# Patient Record
Sex: Female | Born: 1965 | Race: White | Hispanic: No | Smoking: Former smoker
Health system: Southern US, Community
[De-identification: ages and names within clinical notes are randomized; demographics above are authoritative.]

## PROBLEM LIST (undated history)

## (undated) DIAGNOSIS — M810 Age-related osteoporosis without current pathological fracture: Secondary | ICD-10-CM

## (undated) DIAGNOSIS — G4700 Insomnia, unspecified: Secondary | ICD-10-CM

## (undated) DIAGNOSIS — E781 Pure hyperglyceridemia: Secondary | ICD-10-CM

## (undated) DIAGNOSIS — R000 Tachycardia, unspecified: Secondary | ICD-10-CM

## (undated) DIAGNOSIS — K219 Gastro-esophageal reflux disease without esophagitis: Secondary | ICD-10-CM

## (undated) DIAGNOSIS — R002 Palpitations: Secondary | ICD-10-CM

## (undated) DIAGNOSIS — F4001 Agoraphobia with panic disorder: Secondary | ICD-10-CM

## (undated) DIAGNOSIS — F17200 Nicotine dependence, unspecified, uncomplicated: Secondary | ICD-10-CM

## (undated) DIAGNOSIS — C55 Malignant neoplasm of uterus, part unspecified: Secondary | ICD-10-CM

## (undated) DIAGNOSIS — F4321 Adjustment disorder with depressed mood: Secondary | ICD-10-CM

## (undated) HISTORY — DX: Pure hyperglyceridemia: E78.1

## (undated) HISTORY — DX: Palpitations: R00.2

## (undated) HISTORY — DX: Insomnia, unspecified: G47.00

## (undated) HISTORY — PX: OTHER SURGICAL HISTORY: SHX169

## (undated) HISTORY — DX: Adjustment disorder with depressed mood: F43.21

## (undated) HISTORY — DX: Gastro-esophageal reflux disease without esophagitis: K21.9

## (undated) HISTORY — DX: Nicotine dependence, unspecified, uncomplicated: F17.200

## (undated) HISTORY — DX: Malignant neoplasm of uterus, part unspecified: C55

## (undated) HISTORY — DX: Tachycardia, unspecified: R00.0

## (undated) HISTORY — DX: Agoraphobia with panic disorder: F40.01

## (undated) HISTORY — PX: TUBAL LIGATION: SHX77

## (undated) HISTORY — DX: Age-related osteoporosis without current pathological fracture: M81.0

---

## 1998-02-13 HISTORY — PX: OTHER SURGICAL HISTORY: SHX169

## 2001-04-22 ENCOUNTER — Ambulatory Visit (HOSPITAL_COMMUNITY): Admission: RE | Admit: 2001-04-22 | Discharge: 2001-04-23 | Payer: Self-pay | Admitting: Pediatrics

## 2001-05-27 ENCOUNTER — Emergency Department (HOSPITAL_COMMUNITY): Admission: EM | Admit: 2001-05-27 | Discharge: 2001-05-27 | Payer: Self-pay | Admitting: Emergency Medicine

## 2004-03-14 ENCOUNTER — Emergency Department (HOSPITAL_COMMUNITY): Admission: EM | Admit: 2004-03-14 | Discharge: 2004-03-14 | Payer: Self-pay | Admitting: Emergency Medicine

## 2007-06-26 ENCOUNTER — Ambulatory Visit (HOSPITAL_COMMUNITY): Admission: RE | Admit: 2007-06-26 | Discharge: 2007-06-26 | Payer: Self-pay | Admitting: Family Medicine

## 2007-07-15 ENCOUNTER — Ambulatory Visit (HOSPITAL_COMMUNITY): Admission: RE | Admit: 2007-07-15 | Discharge: 2007-07-15 | Payer: Self-pay | Admitting: Family Medicine

## 2007-08-02 ENCOUNTER — Ambulatory Visit (HOSPITAL_COMMUNITY): Admission: RE | Admit: 2007-08-02 | Discharge: 2007-08-02 | Payer: Self-pay | Admitting: Family Medicine

## 2007-12-20 ENCOUNTER — Ambulatory Visit (HOSPITAL_COMMUNITY): Admission: RE | Admit: 2007-12-20 | Discharge: 2007-12-20 | Payer: Self-pay | Admitting: Family Medicine

## 2008-07-01 ENCOUNTER — Ambulatory Visit (HOSPITAL_COMMUNITY): Admission: RE | Admit: 2008-07-01 | Discharge: 2008-07-01 | Payer: Self-pay | Admitting: Obstetrics and Gynecology

## 2009-01-25 ENCOUNTER — Other Ambulatory Visit: Admission: RE | Admit: 2009-01-25 | Discharge: 2009-01-25 | Payer: Self-pay | Admitting: Obstetrics & Gynecology

## 2010-03-06 ENCOUNTER — Encounter: Payer: Self-pay | Admitting: Family Medicine

## 2010-06-13 ENCOUNTER — Other Ambulatory Visit (HOSPITAL_COMMUNITY): Payer: Self-pay | Admitting: Family Medicine

## 2010-06-13 DIAGNOSIS — Z139 Encounter for screening, unspecified: Secondary | ICD-10-CM

## 2010-06-27 ENCOUNTER — Ambulatory Visit (HOSPITAL_COMMUNITY): Payer: Self-pay

## 2010-07-04 ENCOUNTER — Ambulatory Visit (HOSPITAL_COMMUNITY)
Admission: RE | Admit: 2010-07-04 | Discharge: 2010-07-04 | Disposition: A | Discharge status: Home / Self Care | Payer: Medicare Other | Source: Ambulatory Visit | Attending: Family Medicine | Admitting: Family Medicine

## 2010-07-04 DIAGNOSIS — Z1231 Encounter for screening mammogram for malignant neoplasm of breast: Secondary | ICD-10-CM | POA: Insufficient documentation

## 2010-07-04 DIAGNOSIS — Z139 Encounter for screening, unspecified: Secondary | ICD-10-CM

## 2011-02-03 ENCOUNTER — Other Ambulatory Visit (HOSPITAL_COMMUNITY): Payer: Self-pay | Admitting: Family Medicine

## 2011-02-03 DIAGNOSIS — N898 Other specified noninflammatory disorders of vagina: Secondary | ICD-10-CM

## 2011-02-03 DIAGNOSIS — N7011 Chronic salpingitis: Secondary | ICD-10-CM

## 2011-02-09 ENCOUNTER — Ambulatory Visit (HOSPITAL_COMMUNITY): Payer: Medicare Other

## 2011-02-20 ENCOUNTER — Ambulatory Visit (HOSPITAL_COMMUNITY): Payer: Medicare Other

## 2011-07-24 ENCOUNTER — Other Ambulatory Visit (HOSPITAL_COMMUNITY): Payer: Self-pay | Admitting: Family Medicine

## 2011-07-24 DIAGNOSIS — Z139 Encounter for screening, unspecified: Secondary | ICD-10-CM

## 2011-07-25 ENCOUNTER — Ambulatory Visit (HOSPITAL_COMMUNITY)
Admission: RE | Admit: 2011-07-25 | Discharge: 2011-07-25 | Disposition: A | Discharge status: Home / Self Care | Payer: Medicare Other | Source: Ambulatory Visit | Attending: Family Medicine | Admitting: Family Medicine

## 2011-07-25 DIAGNOSIS — Z139 Encounter for screening, unspecified: Secondary | ICD-10-CM

## 2011-07-25 DIAGNOSIS — Z1231 Encounter for screening mammogram for malignant neoplasm of breast: Secondary | ICD-10-CM | POA: Insufficient documentation

## 2011-08-01 ENCOUNTER — Other Ambulatory Visit: Payer: Self-pay | Admitting: Family Medicine

## 2011-08-01 DIAGNOSIS — R928 Other abnormal and inconclusive findings on diagnostic imaging of breast: Secondary | ICD-10-CM

## 2011-08-16 ENCOUNTER — Ambulatory Visit (HOSPITAL_COMMUNITY)
Admission: RE | Admit: 2011-08-16 | Discharge: 2011-08-16 | Disposition: A | Discharge status: Home / Self Care | Payer: Medicare Other | Source: Ambulatory Visit | Attending: Family Medicine | Admitting: Family Medicine

## 2011-08-16 DIAGNOSIS — R928 Other abnormal and inconclusive findings on diagnostic imaging of breast: Secondary | ICD-10-CM | POA: Diagnosis not present

## 2011-08-16 DIAGNOSIS — N63 Unspecified lump in unspecified breast: Secondary | ICD-10-CM | POA: Diagnosis not present

## 2011-08-16 DIAGNOSIS — F329 Major depressive disorder, single episode, unspecified: Secondary | ICD-10-CM | POA: Diagnosis not present

## 2011-08-16 DIAGNOSIS — Z87891 Personal history of nicotine dependence: Secondary | ICD-10-CM | POA: Diagnosis not present

## 2011-08-16 DIAGNOSIS — R000 Tachycardia, unspecified: Secondary | ICD-10-CM | POA: Diagnosis not present

## 2011-08-16 DIAGNOSIS — F4001 Agoraphobia with panic disorder: Secondary | ICD-10-CM | POA: Diagnosis not present

## 2012-02-02 DIAGNOSIS — R000 Tachycardia, unspecified: Secondary | ICD-10-CM | POA: Diagnosis not present

## 2012-02-02 DIAGNOSIS — F4001 Agoraphobia with panic disorder: Secondary | ICD-10-CM | POA: Diagnosis not present

## 2012-02-02 DIAGNOSIS — F329 Major depressive disorder, single episode, unspecified: Secondary | ICD-10-CM | POA: Diagnosis not present

## 2012-02-02 DIAGNOSIS — D259 Leiomyoma of uterus, unspecified: Secondary | ICD-10-CM | POA: Diagnosis not present

## 2012-02-12 ENCOUNTER — Other Ambulatory Visit (HOSPITAL_COMMUNITY): Payer: Self-pay | Admitting: Family Medicine

## 2012-02-12 DIAGNOSIS — N951 Menopausal and female climacteric states: Secondary | ICD-10-CM

## 2012-02-16 ENCOUNTER — Ambulatory Visit (HOSPITAL_COMMUNITY): Admission: RE | Admit: 2012-02-16 | Payer: Medicare Other | Source: Ambulatory Visit

## 2012-02-16 ENCOUNTER — Ambulatory Visit (HOSPITAL_COMMUNITY): Payer: Medicare Other

## 2012-07-28 ENCOUNTER — Other Ambulatory Visit: Payer: Self-pay | Admitting: Family Medicine

## 2012-07-28 DIAGNOSIS — Z139 Encounter for screening, unspecified: Secondary | ICD-10-CM

## 2012-07-29 ENCOUNTER — Ambulatory Visit (HOSPITAL_COMMUNITY)
Admission: RE | Admit: 2012-07-29 | Discharge: 2012-07-29 | Disposition: A | Discharge status: Home / Self Care | Payer: Medicare Other | Source: Ambulatory Visit | Attending: Family Medicine | Admitting: Family Medicine

## 2012-07-29 DIAGNOSIS — Z139 Encounter for screening, unspecified: Secondary | ICD-10-CM

## 2012-07-29 DIAGNOSIS — Z1231 Encounter for screening mammogram for malignant neoplasm of breast: Secondary | ICD-10-CM | POA: Diagnosis not present

## 2012-11-15 DIAGNOSIS — Z0000 Encounter for general adult medical examination without abnormal findings: Secondary | ICD-10-CM | POA: Diagnosis not present

## 2012-11-15 DIAGNOSIS — F4001 Agoraphobia with panic disorder: Secondary | ICD-10-CM | POA: Diagnosis not present

## 2012-11-15 DIAGNOSIS — R000 Tachycardia, unspecified: Secondary | ICD-10-CM | POA: Diagnosis not present

## 2012-11-15 DIAGNOSIS — F329 Major depressive disorder, single episode, unspecified: Secondary | ICD-10-CM | POA: Diagnosis not present

## 2012-12-04 ENCOUNTER — Emergency Department (HOSPITAL_COMMUNITY)
Admission: EM | Admit: 2012-12-04 | Discharge: 2012-12-04 | Disposition: A | Discharge status: Home / Self Care | Payer: Medicare Other

## 2013-07-24 ENCOUNTER — Other Ambulatory Visit (HOSPITAL_COMMUNITY): Payer: Self-pay | Admitting: Family Medicine

## 2013-07-24 DIAGNOSIS — Z1231 Encounter for screening mammogram for malignant neoplasm of breast: Secondary | ICD-10-CM

## 2013-08-04 ENCOUNTER — Ambulatory Visit (HOSPITAL_COMMUNITY): Payer: Medicare Other

## 2013-08-25 ENCOUNTER — Ambulatory Visit (HOSPITAL_COMMUNITY)
Admission: RE | Admit: 2013-08-25 | Discharge: 2013-08-25 | Disposition: A | Discharge status: Home / Self Care | Payer: Medicare Other | Source: Ambulatory Visit | Attending: Family Medicine | Admitting: Family Medicine

## 2013-08-25 DIAGNOSIS — Z1231 Encounter for screening mammogram for malignant neoplasm of breast: Secondary | ICD-10-CM | POA: Diagnosis not present

## 2013-11-10 DIAGNOSIS — F329 Major depressive disorder, single episode, unspecified: Secondary | ICD-10-CM | POA: Diagnosis not present

## 2013-11-10 DIAGNOSIS — R000 Tachycardia, unspecified: Secondary | ICD-10-CM | POA: Diagnosis not present

## 2013-11-10 DIAGNOSIS — G4700 Insomnia, unspecified: Secondary | ICD-10-CM | POA: Diagnosis not present

## 2013-11-10 DIAGNOSIS — F4001 Agoraphobia with panic disorder: Secondary | ICD-10-CM | POA: Diagnosis not present

## 2014-05-01 DIAGNOSIS — F419 Anxiety disorder, unspecified: Secondary | ICD-10-CM | POA: Diagnosis not present

## 2014-05-01 DIAGNOSIS — B351 Tinea unguium: Secondary | ICD-10-CM | POA: Diagnosis not present

## 2014-05-01 DIAGNOSIS — G4700 Insomnia, unspecified: Secondary | ICD-10-CM | POA: Diagnosis not present

## 2014-05-01 DIAGNOSIS — R000 Tachycardia, unspecified: Secondary | ICD-10-CM | POA: Diagnosis not present

## 2014-08-04 ENCOUNTER — Other Ambulatory Visit (HOSPITAL_COMMUNITY): Payer: Self-pay | Admitting: Family Medicine

## 2014-08-04 DIAGNOSIS — Z1231 Encounter for screening mammogram for malignant neoplasm of breast: Secondary | ICD-10-CM

## 2014-08-31 ENCOUNTER — Ambulatory Visit (HOSPITAL_COMMUNITY)
Admission: RE | Admit: 2014-08-31 | Discharge: 2014-08-31 | Disposition: A | Discharge status: Home / Self Care | Payer: Medicare Other | Source: Ambulatory Visit | Attending: Family Medicine | Admitting: Family Medicine

## 2014-08-31 DIAGNOSIS — Z1231 Encounter for screening mammogram for malignant neoplasm of breast: Secondary | ICD-10-CM | POA: Diagnosis not present

## 2014-11-06 DIAGNOSIS — S81011A Laceration without foreign body, right knee, initial encounter: Secondary | ICD-10-CM | POA: Diagnosis not present

## 2014-11-06 DIAGNOSIS — L700 Acne vulgaris: Secondary | ICD-10-CM | POA: Diagnosis not present

## 2014-11-06 DIAGNOSIS — B351 Tinea unguium: Secondary | ICD-10-CM | POA: Diagnosis not present

## 2014-11-06 DIAGNOSIS — F419 Anxiety disorder, unspecified: Secondary | ICD-10-CM | POA: Diagnosis not present

## 2014-11-11 DIAGNOSIS — E785 Hyperlipidemia, unspecified: Secondary | ICD-10-CM | POA: Diagnosis not present

## 2015-05-28 DIAGNOSIS — F4321 Adjustment disorder with depressed mood: Secondary | ICD-10-CM | POA: Diagnosis not present

## 2015-05-28 DIAGNOSIS — G4700 Insomnia, unspecified: Secondary | ICD-10-CM | POA: Diagnosis not present

## 2015-05-28 DIAGNOSIS — F419 Anxiety disorder, unspecified: Secondary | ICD-10-CM | POA: Diagnosis not present

## 2015-05-28 DIAGNOSIS — H9312 Tinnitus, left ear: Secondary | ICD-10-CM | POA: Diagnosis not present

## 2015-09-02 ENCOUNTER — Other Ambulatory Visit (HOSPITAL_COMMUNITY): Payer: Self-pay | Admitting: Family Medicine

## 2015-09-02 DIAGNOSIS — Z1231 Encounter for screening mammogram for malignant neoplasm of breast: Secondary | ICD-10-CM

## 2015-09-06 ENCOUNTER — Ambulatory Visit (HOSPITAL_COMMUNITY)
Admission: RE | Admit: 2015-09-06 | Discharge: 2015-09-06 | Disposition: A | Discharge status: Home / Self Care | Payer: Medicare Other | Source: Ambulatory Visit | Attending: Family Medicine | Admitting: Family Medicine

## 2015-09-06 ENCOUNTER — Encounter (HOSPITAL_COMMUNITY): Payer: Self-pay | Admitting: Radiology

## 2015-09-06 DIAGNOSIS — Z1231 Encounter for screening mammogram for malignant neoplasm of breast: Secondary | ICD-10-CM | POA: Diagnosis not present

## 2015-09-30 DIAGNOSIS — G4700 Insomnia, unspecified: Secondary | ICD-10-CM | POA: Diagnosis not present

## 2015-09-30 DIAGNOSIS — F419 Anxiety disorder, unspecified: Secondary | ICD-10-CM | POA: Diagnosis not present

## 2015-09-30 DIAGNOSIS — J018 Other acute sinusitis: Secondary | ICD-10-CM | POA: Diagnosis not present

## 2015-09-30 DIAGNOSIS — H9313 Tinnitus, bilateral: Secondary | ICD-10-CM | POA: Diagnosis not present

## 2015-09-30 DIAGNOSIS — G4452 New daily persistent headache (NDPH): Secondary | ICD-10-CM | POA: Diagnosis not present

## 2015-10-01 ENCOUNTER — Other Ambulatory Visit (HOSPITAL_COMMUNITY): Payer: Self-pay | Admitting: Neurology

## 2015-10-01 DIAGNOSIS — R519 Headache, unspecified: Secondary | ICD-10-CM

## 2015-10-01 DIAGNOSIS — R51 Headache: Principal | ICD-10-CM

## 2015-10-11 ENCOUNTER — Ambulatory Visit (HOSPITAL_COMMUNITY)
Admission: RE | Admit: 2015-10-11 | Discharge: 2015-10-11 | Disposition: A | Discharge status: Home / Self Care | Payer: Medicare Other | Source: Ambulatory Visit | Attending: Neurology | Admitting: Neurology

## 2015-10-11 DIAGNOSIS — R51 Headache: Secondary | ICD-10-CM | POA: Diagnosis not present

## 2015-10-11 DIAGNOSIS — R519 Headache, unspecified: Secondary | ICD-10-CM

## 2015-10-27 DIAGNOSIS — J018 Other acute sinusitis: Secondary | ICD-10-CM | POA: Diagnosis not present

## 2015-10-27 DIAGNOSIS — F419 Anxiety disorder, unspecified: Secondary | ICD-10-CM | POA: Diagnosis not present

## 2015-10-27 DIAGNOSIS — H9313 Tinnitus, bilateral: Secondary | ICD-10-CM | POA: Diagnosis not present

## 2015-10-27 DIAGNOSIS — G43701 Chronic migraine without aura, not intractable, with status migrainosus: Secondary | ICD-10-CM | POA: Diagnosis not present

## 2015-10-27 DIAGNOSIS — G4700 Insomnia, unspecified: Secondary | ICD-10-CM | POA: Diagnosis not present

## 2015-11-15 ENCOUNTER — Ambulatory Visit (INDEPENDENT_AMBULATORY_CARE_PROVIDER_SITE_OTHER): Payer: Medicare Other | Admitting: Otolaryngology

## 2015-11-15 DIAGNOSIS — H9312 Tinnitus, left ear: Secondary | ICD-10-CM | POA: Diagnosis not present

## 2015-11-15 DIAGNOSIS — H903 Sensorineural hearing loss, bilateral: Secondary | ICD-10-CM | POA: Diagnosis not present

## 2016-01-12 DIAGNOSIS — H9192 Unspecified hearing loss, left ear: Secondary | ICD-10-CM | POA: Diagnosis not present

## 2016-01-12 DIAGNOSIS — F419 Anxiety disorder, unspecified: Secondary | ICD-10-CM | POA: Diagnosis not present

## 2016-01-12 DIAGNOSIS — E785 Hyperlipidemia, unspecified: Secondary | ICD-10-CM | POA: Diagnosis not present

## 2016-01-12 DIAGNOSIS — J320 Chronic maxillary sinusitis: Secondary | ICD-10-CM | POA: Diagnosis not present

## 2016-01-12 DIAGNOSIS — K210 Gastro-esophageal reflux disease with esophagitis: Secondary | ICD-10-CM | POA: Diagnosis not present

## 2016-04-10 ENCOUNTER — Encounter: Payer: Self-pay | Admitting: Obstetrics & Gynecology

## 2016-05-15 ENCOUNTER — Ambulatory Visit (INDEPENDENT_AMBULATORY_CARE_PROVIDER_SITE_OTHER): Payer: Medicare Other | Admitting: Otolaryngology

## 2016-09-11 ENCOUNTER — Other Ambulatory Visit (HOSPITAL_COMMUNITY): Payer: Self-pay | Admitting: Family Medicine

## 2016-09-11 DIAGNOSIS — Z1231 Encounter for screening mammogram for malignant neoplasm of breast: Secondary | ICD-10-CM

## 2016-09-18 ENCOUNTER — Ambulatory Visit (HOSPITAL_COMMUNITY): Payer: Medicare Other

## 2016-09-25 ENCOUNTER — Ambulatory Visit (HOSPITAL_COMMUNITY)
Admission: RE | Admit: 2016-09-25 | Discharge: 2016-09-25 | Disposition: A | Discharge status: Home / Self Care | Payer: Medicare Other | Source: Ambulatory Visit | Attending: Family Medicine | Admitting: Family Medicine

## 2016-09-25 DIAGNOSIS — Z1231 Encounter for screening mammogram for malignant neoplasm of breast: Secondary | ICD-10-CM | POA: Diagnosis present

## 2016-11-16 IMAGING — CT CT HEAD W/O CM
3 of 4 series · 16 of 47 positions shown, 19 images · non-contrast
Comparison: None.

CLINICAL DATA: Headache, dizziness, blurred vision for 2-3 months.

EXAM:
CT HEAD WITHOUT CONTRAST
TECHNIQUE: Contiguous axial images were obtained from the base of the skull
through the vertex without intravenous contrast.

[Series 2: head w/o · axial · non-contrast · 0.43mm/px · z∈[+1188,+1328]mm · 10 of 41 slices shown, 13 images]
[im 3/41  brain]
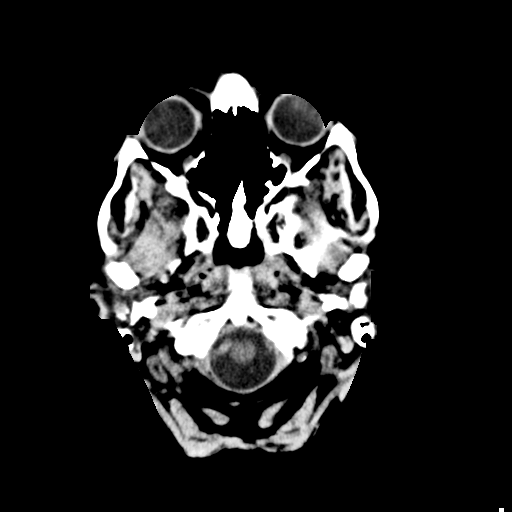
[im 3/41  bone]
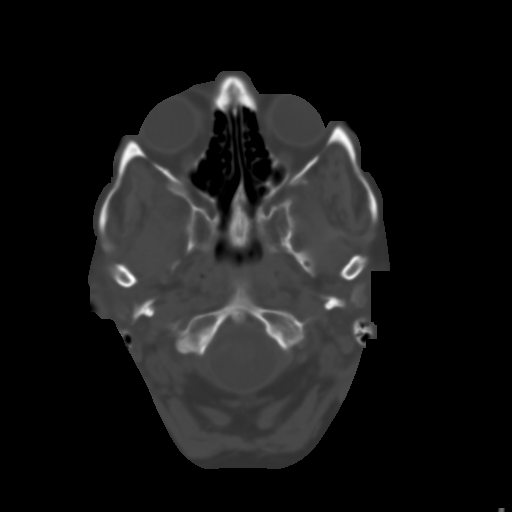
[im 6/41  brain]
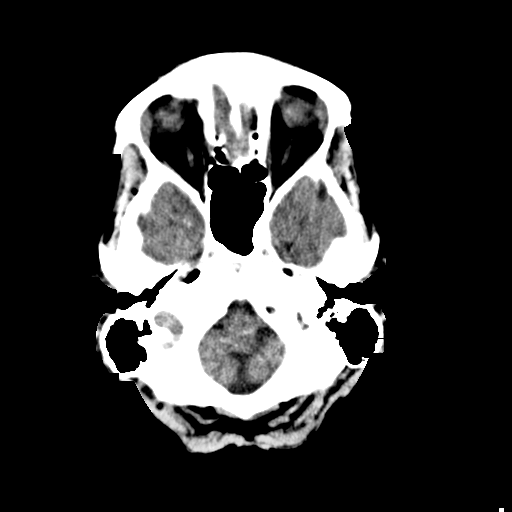
[im 12/41  brain]
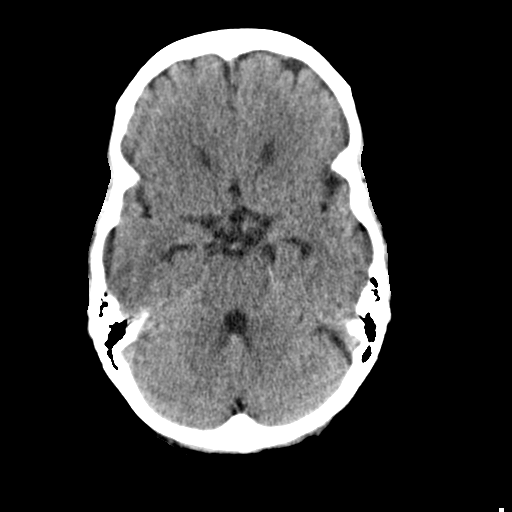
[im 15/41  brain]
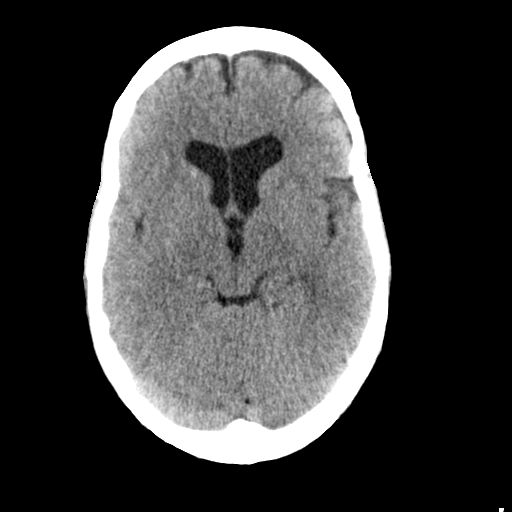
[im 18/41  brain]
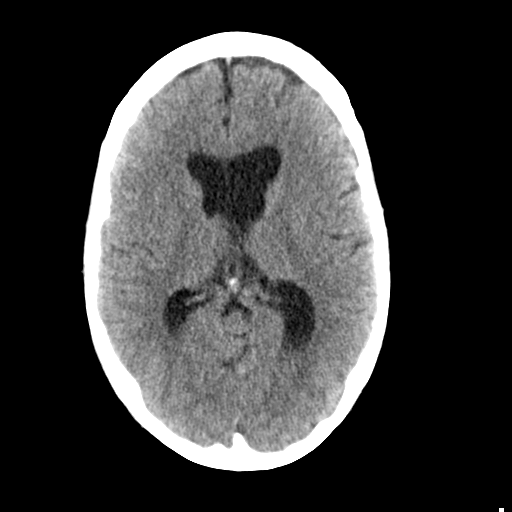
[im 18/41  bone]
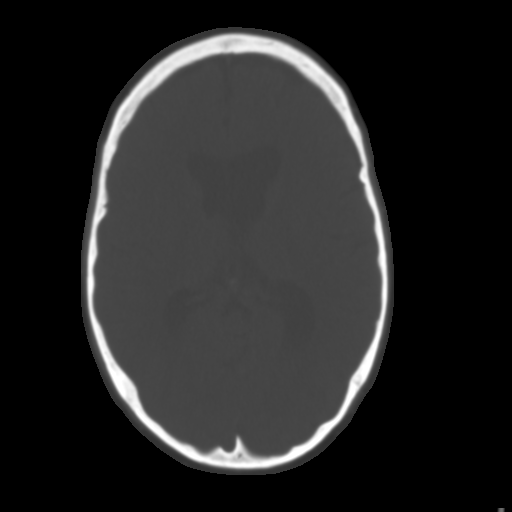
[im 23/41  brain]
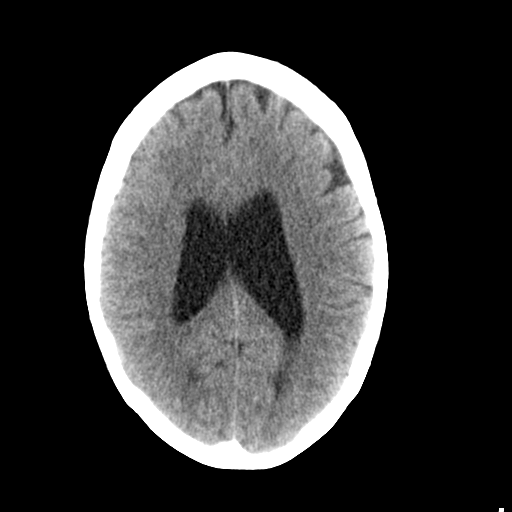
[im 26/41  brain]
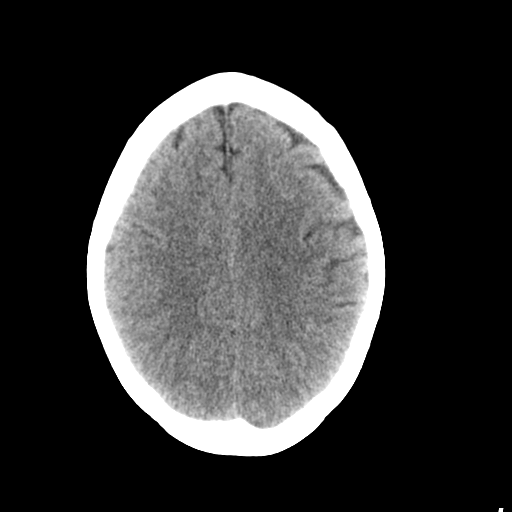
[im 29/41  brain]
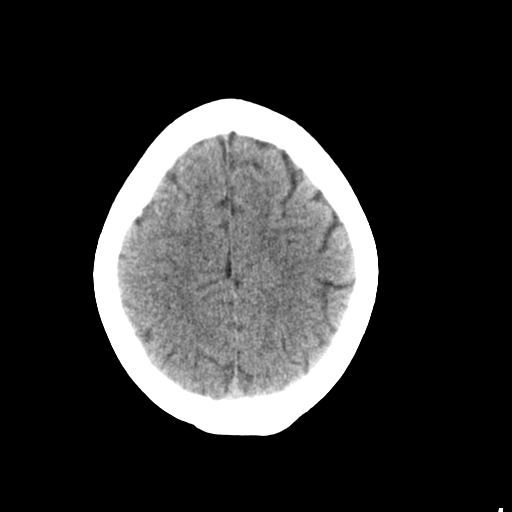
[im 35/41  brain]
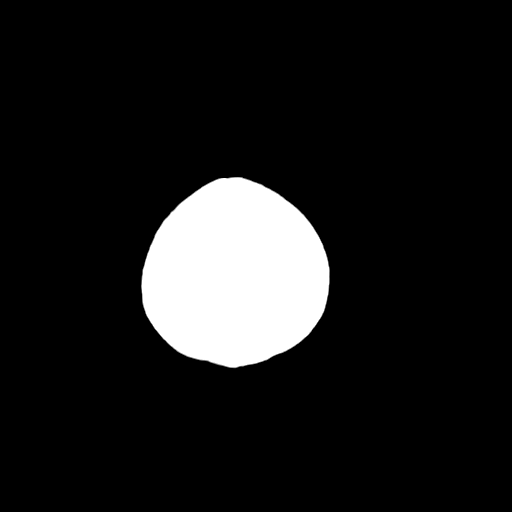
[im 35/41  bone]
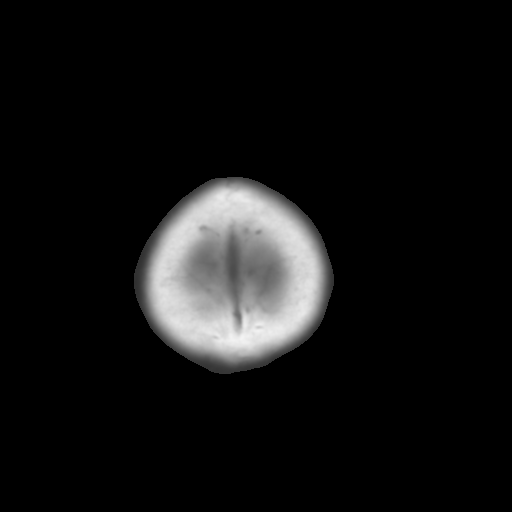
[im 38/41  brain]
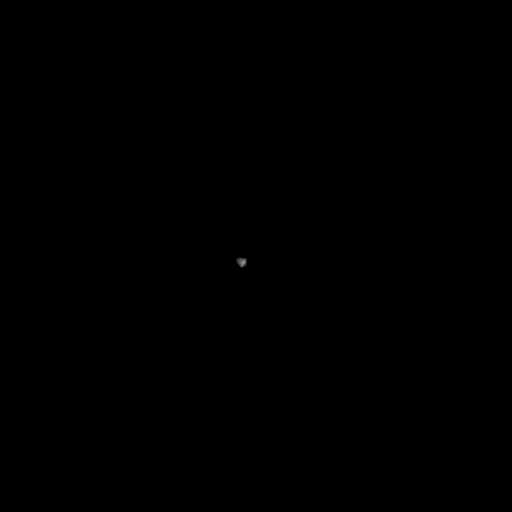

[Series 4: coronal · coronal · 0.30mm/px · 3 of 70 slices shown]
[im 24/70  brain]
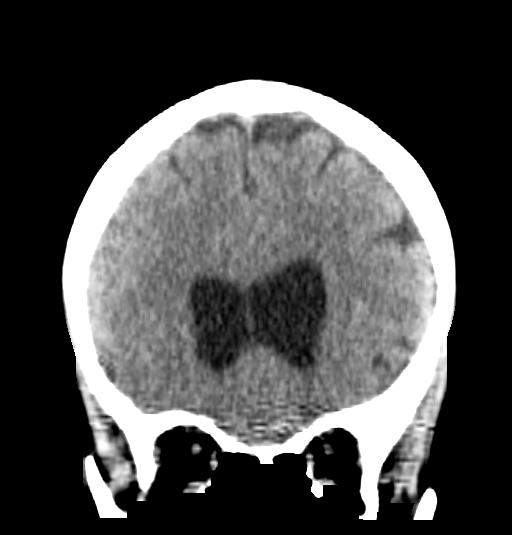
[im 31/70  brain]
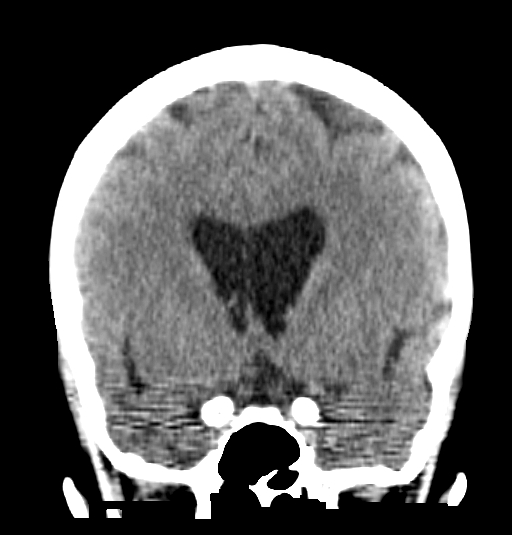
[im 39/70  brain]
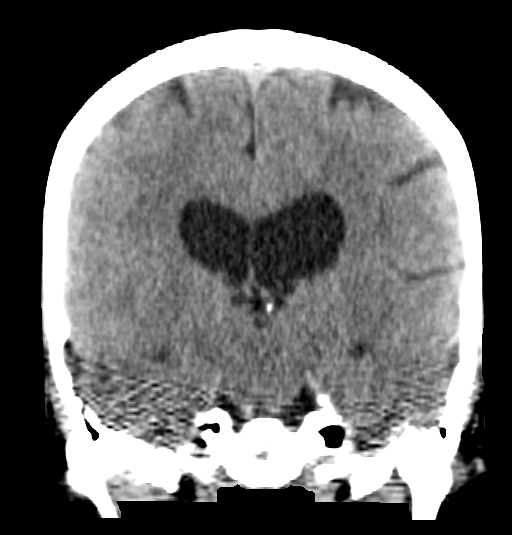

[Series 5: sagittal · sagittal · 0.33mm/px · 3 of 52 slices shown]
[im 18/52  brain]
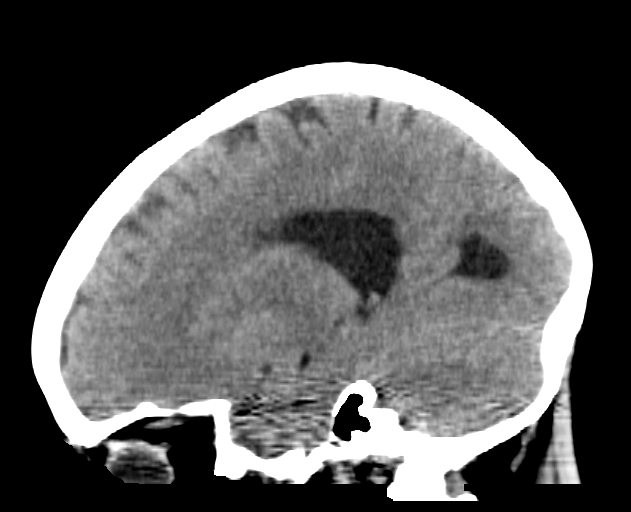
[im 26/52  brain]
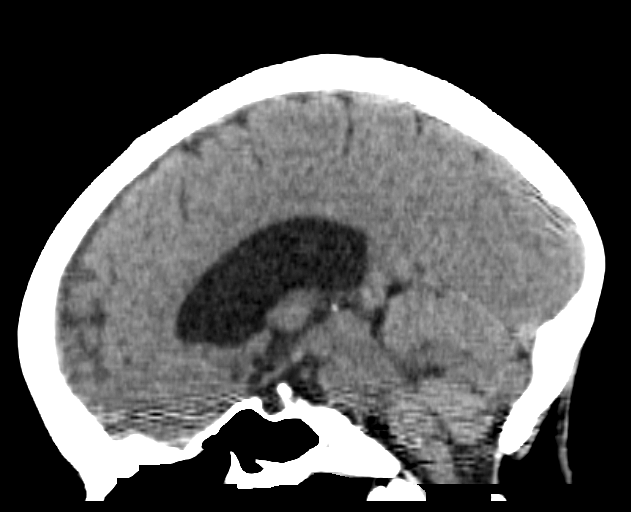
[im 35/52  brain]
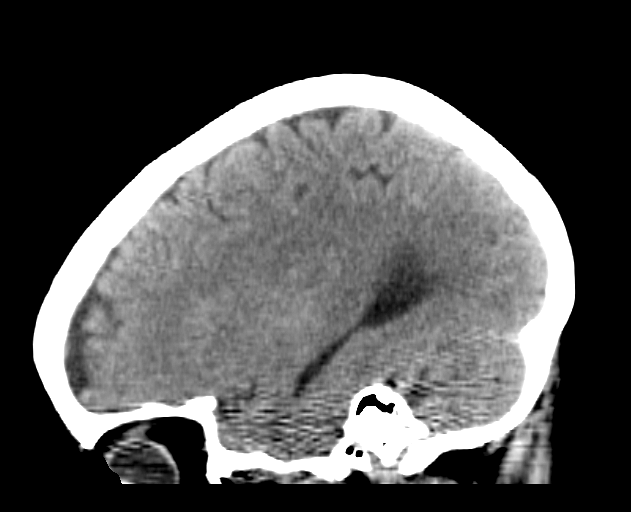

[16 of 47 positions shown; findings below may reference images not displayed]

FINDINGS: Brain: No acute intracranial abnormality. Specifically, no
hemorrhage, hydrocephalus, mass lesion, acute infarction, or
significant intracranial injury.

Vascular: No hyperdense vessel or unexpected calcification.

Skull: No acute calvarial abnormality

Sinuses/Orbits: Visualized paranasal sinuses and mastoids clear.
Orbital soft tissues unremarkable.

Other: None
IMPRESSION: No intracranial abnormality.

## 2017-09-17 ENCOUNTER — Other Ambulatory Visit (HOSPITAL_COMMUNITY): Payer: Self-pay | Admitting: Family Medicine

## 2017-09-17 DIAGNOSIS — Z1231 Encounter for screening mammogram for malignant neoplasm of breast: Secondary | ICD-10-CM

## 2017-10-01 ENCOUNTER — Ambulatory Visit (HOSPITAL_COMMUNITY)
Admission: RE | Admit: 2017-10-01 | Discharge: 2017-10-01 | Disposition: A | Discharge status: Home / Self Care | Payer: Medicare Other | Source: Ambulatory Visit | Attending: Family Medicine | Admitting: Family Medicine

## 2017-10-01 DIAGNOSIS — Z1231 Encounter for screening mammogram for malignant neoplasm of breast: Secondary | ICD-10-CM

## 2018-02-07 ENCOUNTER — Ambulatory Visit: Payer: Medicare Other | Admitting: Cardiology

## 2018-02-22 ENCOUNTER — Ambulatory Visit (INDEPENDENT_AMBULATORY_CARE_PROVIDER_SITE_OTHER): Payer: Medicare Other | Admitting: Cardiology

## 2018-02-22 ENCOUNTER — Encounter: Payer: Self-pay | Admitting: Cardiology

## 2018-02-22 ENCOUNTER — Ambulatory Visit (INDEPENDENT_AMBULATORY_CARE_PROVIDER_SITE_OTHER): Payer: Medicare Other

## 2018-02-22 VITALS — BP 114/60 | Ht 65.5 in | Wt 146.0 lb

## 2018-02-22 DIAGNOSIS — R002 Palpitations: Secondary | ICD-10-CM | POA: Diagnosis not present

## 2018-02-22 DIAGNOSIS — Z01810 Encounter for preprocedural cardiovascular examination: Secondary | ICD-10-CM

## 2018-02-22 NOTE — Progress Notes (Signed)
Clinical Summary Ms. Shippey is a 53 y.o.female seen as new consult, referred by Dr Sudie Bailey for palpitations  1. Palpitations - previously seen by Dr Dietrich Pates she thinks late 1990s for palpitations. Seen by another cardiologist a few years later for same issue. Reports wearing event monitors during that time, believes they were benign or perhaps some extra heart beats.   Ongoing feeling of heart racing, can occur at rest. No other associated symptoms.  - lasts 15-30 minutes - reports chronic high stress - has been atentolol over 20 years - soft bp's limit av nodal agents - heistant to try new meds.She would favor staying on atenolol and increasing if needed   2. Preoperative evaluation - being considered for polyp removal by ob/gyn Walks up flight of stairs regularly at home without troubles.      Past Medical History:  Diagnosis Date  . Adjustment disorder with depressed mood   . Agoraphobia with panic attacks   . GERD (gastroesophageal reflux disease)   . Heart palpitations   . Insomnia   . Nicotine dependence   . Pure hyperglyceridemia   . Tachycardia      Allergies  Allergen Reactions  . Antihistamines, Chlorpheniramine-Type     Allergic to antihistamines too     Current Outpatient Medications  Medication Sig Dispense Refill  . acetaminophen (TYLENOL) 500 MG tablet Take 500 mg by mouth every 6 (six) hours as needed.    . ALPRAZolam (XANAX) 1 MG tablet Take 1 mg by mouth 4 (four) times daily as needed for anxiety.    Marland Kitchen atenolol (TENORMIN) 25 MG tablet Take 12.5 mg by mouth daily.    . calcium carbonate (TUMS - DOSED IN MG ELEMENTAL CALCIUM) 500 MG chewable tablet Chew 1 tablet by mouth daily.    Marland Kitchen doxepin (SINEQUAN) 10 MG capsule Take 10 mg by mouth. Take up to 5 pills at bedtime    . fenofibrate (TRICOR) 145 MG tablet Take 145 mg by mouth daily.    Marland Kitchen omeprazole (PRILOSEC) 20 MG capsule Take 20 mg by mouth daily.     No current facility-administered  medications for this visit.         Allergies  Allergen Reactions  . Antihistamines, Chlorpheniramine-Type     Allergic to antihistamines too      Family History  Problem Relation Age of Onset  . Heart attack Father      Social History Ms. Bunting has no history on file for tobacco. Ms. Valk has no history on file for alcohol.   Review of Systems CONSTITUTIONAL: No weight loss, fever, chills, weakness or fatigue.  HEENT: Eyes: No visual loss, blurred vision, double vision or yellow sclerae.No hearing loss, sneezing, congestion, runny nose or sore throat.  SKIN: No rash or itching.  CARDIOVASCULAR: per hpi RESPIRATORY: No shortness of breath, cough or sputum.  GASTROINTESTINAL: No anorexia, nausea, vomiting or diarrhea. No abdominal pain or blood.  GENITOURINARY: No burning on urination, no polyuria NEUROLOGICAL: No headache, dizziness, syncope, paralysis, ataxia, numbness or tingling in the extremities. No change in bowel or bladder control.  MUSCULOSKELETAL: No muscle, back pain, joint pain or stiffness.  LYMPHATICS: No enlarged nodes. No history of splenectomy.  PSYCHIATRIC: No history of depression or anxiety.  ENDOCRINOLOGIC: No reports of sweating, cold or heat intolerance. No polyuria or polydipsia.  Marland Kitchen   Physical Examination Vitals:   02/22/18 1502 02/22/18 1511  BP: 116/62 114/60  SpO2: 98%    Vitals:   02/22/18  1502  Weight: 146 lb (66.2 kg)  Height: 5' 5.5" (1.664 m)    Gen: resting comfortably, no acute distress HEENT: no scleral icterus, pupils equal round and reactive, no palptable cervical adenopathy,  CV: RRR, no m/r/g, no jvd Resp: Clear to auscultation bilaterally GI: abdomen is soft, non-tender, non-distended, normal bowel sounds, no hepatosplenomegaly MSK: extremities are warm, no edema.  Skin: warm, no rash Neuro:  no focal deficits Psych: appropriate affect      Assessment and Plan  1. Palpitations - obtain 7 day event  monitor to further evaluate - if med changes needed, would increase atenolol to 12.5mg  bid as she is not in favor of trying alternative meds due to prior medication sensitivities - EKG today shows sinus rhythm at 100  2. Preoperative evaluation - tolerates greater than without troubles - if benign cardiac monitor would recommend proceeding with ob/gyn surgery as planned   F/u pending monitor results   Antoine Poche, M.D.

## 2018-02-22 NOTE — Patient Instructions (Signed)
Medication Instructions:  Your physician recommends that you continue on your current medications as directed. Please refer to the Current Medication list given to you today.   Labwork: I WILL REQUEST LABS FROM PCP  Testing/Procedures: Your physician has recommended that you wear an event monitor. Event monitors are medical devices that record the heart's electrical activity. Doctors most often [place] these monitors to diagnose arrhythmias. Arrhythmias are problems with the speed or rhythm of the heartbeat. The monitor is a small, portable device. You can wear one while you do your normal daily activities. This is usually used to diagnose what is causing palpitations/syncope (passing out).   Follow-Up: Your physician recommends that you schedule a follow-up appointment in: PENDING TEST RESULTS    Any Other Special Instructions Will Be Listed Below (If Applicable).     If you need a refill on your cardiac medications before your next appointment, please call your pharmacy.

## 2018-03-01 DIAGNOSIS — R002 Palpitations: Secondary | ICD-10-CM | POA: Diagnosis not present

## 2018-03-13 ENCOUNTER — Telehealth: Payer: Self-pay | Admitting: Cardiovascular Disease

## 2018-03-13 NOTE — Telephone Encounter (Signed)
Patient called for test results / tg

## 2018-03-13 NOTE — Telephone Encounter (Signed)
Will forward to Dr. Wyline MoodBranch to advise. Monitor uploaded.

## 2018-03-18 ENCOUNTER — Telehealth: Payer: Self-pay

## 2018-03-18 NOTE — Telephone Encounter (Signed)
Monitor just resulted   Dominga FerryJ Branch MD

## 2018-03-18 NOTE — Telephone Encounter (Signed)
Called pt. No answer. Unable to leave voicemail.

## 2018-03-18 NOTE — Telephone Encounter (Signed)
-----   Message from Jonathan F Branch, MD sent at 03/18/2018  1:24 PM EST ----- Monitor overall looks good. Just some occasional extra heart beats, at time she can have a few in a row. This is considered benign and are very common, but can cause palpitations. There are no significant abnormal rhythms, ok to proceed with her surgery. We can increase her atenolol to 12.5mg bid to help with symptoms but would need to be on higher dose for at least a week before surgery, f/u 4 months   J Branch MD 

## 2018-03-18 NOTE — Telephone Encounter (Signed)
Pt has called again wanting results of monitor. Will forward to Dr. Wyline Mood as an Lorain Childes.

## 2018-03-20 NOTE — Telephone Encounter (Signed)
Resulted to pt.

## 2018-03-26 ENCOUNTER — Telehealth: Payer: Self-pay

## 2018-03-26 MED ORDER — ATENOLOL 25 MG PO TABS: 12.5000 mg | ORAL_TABLET | Freq: Two times a day (BID) | ORAL | 3 refills | Status: DC

## 2018-03-26 NOTE — Telephone Encounter (Signed)
Pt notified, copy of monitor with Dr.Branch's comment that patient may continue with surgery faxed to [facility], fax [phone]

## 2018-03-26 NOTE — Telephone Encounter (Signed)
-----   Message from Antoine PocheJonathan F Branch, MD sent at 03/18/2018  1:24 PM EST ----- Monitor overall looks good. Just some occasional extra heart beats, at time she can have a few in a row. This is considered benign and are very common, but can cause palpitations. There are no significant abnormal rhythms, ok to proceed with her surgery. We can increase her atenolol to 12.5mg  bid to help with symptoms but would need to be on higher dose for at least a week before surgery, f/u 4 months   Dominga FerryJ Branch MD

## 2018-09-02 ENCOUNTER — Other Ambulatory Visit (HOSPITAL_COMMUNITY): Payer: Self-pay | Admitting: Family Medicine

## 2018-09-02 DIAGNOSIS — Z1231 Encounter for screening mammogram for malignant neoplasm of breast: Secondary | ICD-10-CM

## 2018-10-07 ENCOUNTER — Ambulatory Visit (HOSPITAL_COMMUNITY): Payer: Medicare Other

## 2018-12-24 ENCOUNTER — Other Ambulatory Visit: Payer: Self-pay | Admitting: Cardiology

## 2019-06-05 ENCOUNTER — Other Ambulatory Visit (HOSPITAL_COMMUNITY): Payer: Self-pay | Admitting: Family Medicine

## 2019-06-05 DIAGNOSIS — Z1231 Encounter for screening mammogram for malignant neoplasm of breast: Secondary | ICD-10-CM

## 2019-06-09 ENCOUNTER — Ambulatory Visit (HOSPITAL_COMMUNITY)
Admission: RE | Admit: 2019-06-09 | Discharge: 2019-06-09 | Disposition: A | Discharge status: Home / Self Care | Payer: Medicare Other | Source: Ambulatory Visit | Attending: Family Medicine | Admitting: Family Medicine

## 2019-06-09 ENCOUNTER — Other Ambulatory Visit: Payer: Self-pay

## 2019-06-09 DIAGNOSIS — Z1231 Encounter for screening mammogram for malignant neoplasm of breast: Secondary | ICD-10-CM | POA: Diagnosis present

## 2019-10-30 ENCOUNTER — Ambulatory Visit: Payer: Self-pay | Admitting: *Deleted

## 2019-10-30 NOTE — Telephone Encounter (Signed)
Summary: Clinical Advice   Patient would like to speak with nurse on recommendations of quarantining due to a family member that lives with her possible being exposed to covid and experience symptoms while awaiting his covid test results. Please advise      Patient and sister, Lanora Manis have been notified of possible exposure to covid from her son being exposed at work. Son symptoms are runny nose and watery eyes, works in Aeronautical engineer. Son awaiting to be tested tomorrow. Elizabeth, hx COPD and wears O2 reports dry cough. Patient reports she has been around grandchildren with cough, diarrhea. Patient due to have 2nd shot of Moderna vaccine 11/07/19. Patient and sister report they will get tested. offered to set up testing but patient and sister will call. reveiewed isolation precautions. Patient and sister verbalized understanding of care advise and to call back if symptoms begin or worsen and notify PCP.   Reason for Disposition . [1] Living in area with community spread (identified by local PHD) BUT [2] NO symptoms  Answer Assessment - Initial Assessment Questions 1. COVID-19 EXPOSURE: "Please describe how you were exposed to someone with a COVID-19 infection."     patient and sister Lanora Manis possible exposure from her nephew from his work 2. PLACE of CONTACT: "Where were you when you were exposed to COVID-19?" (e.g., home, school, medical waiting room; which city?)     Home  3. TYPE of CONTACT: "How much contact was there?" (e.g., sitting next to, live in same house, work in same office, same building)     Live in the same house 4. DURATION of CONTACT: "How long were you in contact with the COVID-19 patient?" (e.g., a few seconds, passed by person, a few minutes, 15 minutes or longer, live with the patient)     Lives with patient 5. MASK: "Were you wearing a mask?" "Was the other person wearing a mask?" Note: wearing a mask reduces the risk of an otherwise close contact.     no 6. DATE of  CONTACT: "When did you have contact with a COVID-19 patient?" (e.g., how many days ago)     Not sure  7. COMMUNITY SPREAD: "Are there lots of cases of COVID-19 (community spread) where you live?" (See public health department website, if unsure)       Yes  8. SYMPTOMS: "Do you have any symptoms?" (e.g., fever, cough, breathing difficulty, loss of taste or smell)     No  9. PREGNANCY OR POSTPARTUM: "Is there any chance you are pregnant?" "When was your last menstrual period?" "Did you deliver in the last 2 weeks?"     No  10. HIGH RISK: "Do you have any heart or lung problems?" "Do you have a weak immune system?" (e.g., heart failure, COPD, asthma, HIV positive, chemotherapy, renal failure, diabetes mellitus, sickle cell anemia, obesity)       Sister, has COPD 11. TRAVEL: "Have you traveled out of the country recently?" If Yes, ask: "When and where?" Also ask about out-of-state travel, since the CDC has identified some high-risk cities for community spread in the [place]. Note: Travel becomes less relevant if there is widespread community transmission where the patient lives.       na  Protocols used: CORONAVIRUS (COVID-19) EXPOSURE-A-AH

## 2019-10-31 ENCOUNTER — Other Ambulatory Visit: Payer: Self-pay

## 2019-10-31 ENCOUNTER — Other Ambulatory Visit: Payer: Medicare Other

## 2019-10-31 DIAGNOSIS — Z20822 Contact with and (suspected) exposure to covid-19: Secondary | ICD-10-CM

## 2019-11-03 LAB — NOVEL CORONAVIRUS, NAA: SARS-CoV-2, NAA: NOT DETECTED

## 2019-11-17 ENCOUNTER — Ambulatory Visit (INDEPENDENT_AMBULATORY_CARE_PROVIDER_SITE_OTHER): Payer: Medicare Other | Admitting: Gastroenterology

## 2020-01-12 ENCOUNTER — Other Ambulatory Visit (HOSPITAL_COMMUNITY)
Admission: RE | Admit: 2020-01-12 | Discharge: 2020-01-12 | Disposition: A | Discharge status: Home / Self Care | Payer: Medicare Other | Source: Ambulatory Visit | Attending: Adult Health | Admitting: Adult Health

## 2020-01-12 ENCOUNTER — Other Ambulatory Visit: Payer: Self-pay

## 2020-01-12 ENCOUNTER — Encounter: Payer: Self-pay | Admitting: Adult Health

## 2020-01-12 ENCOUNTER — Ambulatory Visit (INDEPENDENT_AMBULATORY_CARE_PROVIDER_SITE_OTHER): Payer: Medicare Other | Admitting: Adult Health

## 2020-01-12 VITALS — BP 118/78 | HR 103 | Ht 65.5 in | Wt 144.0 lb

## 2020-01-12 DIAGNOSIS — R102 Pelvic and perineal pain: Secondary | ICD-10-CM | POA: Insufficient documentation

## 2020-01-12 DIAGNOSIS — N898 Other specified noninflammatory disorders of vagina: Secondary | ICD-10-CM | POA: Diagnosis present

## 2020-01-12 DIAGNOSIS — Z113 Encounter for screening for infections with a predominantly sexual mode of transmission: Secondary | ICD-10-CM | POA: Insufficient documentation

## 2020-01-12 DIAGNOSIS — R350 Frequency of micturition: Secondary | ICD-10-CM | POA: Insufficient documentation

## 2020-01-12 DIAGNOSIS — R319 Hematuria, unspecified: Secondary | ICD-10-CM | POA: Insufficient documentation

## 2020-01-12 LAB — POCT URINALYSIS DIPSTICK
Glucose, UA: NEGATIVE
Ketones, UA: NEGATIVE
Nitrite, UA: NEGATIVE
Protein, UA: NEGATIVE

## 2020-01-12 NOTE — Progress Notes (Signed)
  Subjective:     Patient ID: Stacey Macdonald, female   DOB: 01/12/1966, 54 y.o.   MRN: [id number]  HPI Stacey Macdonald is a 54 year old white female,divorced, PM in complaining of vaginal discharge with odor and has pelvic pain for years, has [place] and [facility] in [place] in 2019. She said they did biopsy that was negative but thought they told her she had cyst.  Had not had sex since 2004 and had sex September 2021. PCP is Dr Sudie Bailey.  Review of Systems +vaginal discharge with odor +eplvic pain Reviewed past medical,surgical, social and family history. Reviewed medications and allergies.     Objective:   Physical Exam BP 118/78 (BP Location: Left Arm, Patient Position: Sitting, Cuff Size: Normal)   Pulse (!) 103   Ht 5' 5.5" (1.664 m)   Wt 144 lb (65.3 kg)   LMP 07/08/2013 Comment: perimenopausal  BMI 23.60 kg/m urine dipstick trace leuks and blood. Skin warm and dry.Pelvic: external genitalia is normal in appearance no lesions, vagina: white discharge without odor,sidewalls pale,urethra has no lesions or masses noted, cervix:smooth, uterus: normal size, shape and contour, mildly tender, no masses felt, adnexa: no masses or tenderness noted. Bladder is non tender and no masses felt. CV swab obtained. Fall risk is low AA is 0 PHQ 9 score is 0   [facility] - 01/12/20 1611      Pregnancy Intention Screening   Does the patient want to become pregnant in the next year? N/A    Does the patient's partner want to become pregnant in the next year? N/A    Would the patient like to discuss contraceptive options today? N/A      Contraception Wrap Up   Current Method Female Sterilization    End Method Female Sterilization    Contraception Counseling Provided No         Examination chaperoned by Malachy Mood LPN    Assessment:    1. Vaginal discharge CV swab sent   2. Vaginal odor CV swab sent   3. Pelvic pain Return in about 2 weeks for GYN [place]   4. Screening examination for STD  (sexually transmitted disease) CV swab sent   5. Hematuria, unspecified type UA C&S sent   6. Urinary frequency     Plan:     Will talk after results back and then get back in for pap smear at her request

## 2020-01-12 NOTE — Addendum Note (Signed)
Addended by: Colen Darling on: 01/12/2020 04:50 PM   Modules accepted: Orders

## 2020-01-14 ENCOUNTER — Telehealth: Payer: Self-pay | Admitting: Adult Health

## 2020-01-14 LAB — URINALYSIS, ROUTINE W REFLEX MICROSCOPIC
Bilirubin, UA: NEGATIVE
Glucose, UA: NEGATIVE
Ketones, UA: NEGATIVE
Leukocytes,UA: NEGATIVE
Nitrite, UA: NEGATIVE
RBC, UA: NEGATIVE
Specific Gravity, UA: 1.029 (ref 1.005–1.030)
Urobilinogen, Ur: 0.2 mg/dL (ref 0.2–1.0)
pH, UA: 5.5 (ref 5.0–7.5)

## 2020-01-14 LAB — CERVICOVAGINAL ANCILLARY ONLY
Bacterial Vaginitis (gardnerella): NEGATIVE
Candida Glabrata: NEGATIVE
Candida Vaginitis: NEGATIVE
Chlamydia: NEGATIVE
Comment: NEGATIVE
Comment: NEGATIVE
Comment: NEGATIVE
Comment: NEGATIVE
Comment: NEGATIVE
Comment: NORMAL
Neisseria Gonorrhea: NEGATIVE
Trichomonas: NEGATIVE

## 2020-01-14 LAB — MICROSCOPIC EXAMINATION
Bacteria, UA: NONE SEEN
Casts: NONE SEEN /lpf
Epithelial Cells (non renal): >10 /hpf — AB (ref 0–10)

## 2020-01-14 NOTE — Telephone Encounter (Signed)
Left message to call me, if she calls CV swab was all negative

## 2020-01-16 ENCOUNTER — Telehealth: Payer: Self-pay | Admitting: Adult Health

## 2020-01-16 LAB — URINE CULTURE

## 2020-01-16 MED ORDER — SULFAMETHOXAZOLE-TRIMETHOPRIM 800-160 MG PO TABS: 1.0000 | ORAL_TABLET | Freq: Two times a day (BID) | ORAL | 0 refills | Status: DC

## 2020-01-16 NOTE — Telephone Encounter (Signed)
Left message that I sent in Rx septra ds  for UTI to [facility]

## 2020-01-26 ENCOUNTER — Other Ambulatory Visit: Payer: Self-pay

## 2020-01-26 ENCOUNTER — Ambulatory Visit (INDEPENDENT_AMBULATORY_CARE_PROVIDER_SITE_OTHER): Payer: Medicare Other

## 2020-01-26 DIAGNOSIS — R102 Pelvic and perineal pain: Secondary | ICD-10-CM | POA: Diagnosis not present

## 2020-01-26 NOTE — Progress Notes (Signed)
PELVIC US TA/TV: heterogeneous anteverted uterus,fundal right intramural fibroid 1.8 x 1.1 x .8 cm,EEC 4 mm,4 x 1 x 4 mm simple cyst within the endometrium,normal ovaries,ovaries appear mobile,left adnexal discomfort,no free fluid   Chaperone Peggy

## 2020-05-13 ENCOUNTER — Other Ambulatory Visit: Payer: Medicare Other | Admitting: Obstetrics & Gynecology

## 2020-05-19 ENCOUNTER — Other Ambulatory Visit (HOSPITAL_COMMUNITY): Payer: Self-pay | Admitting: Family Medicine

## 2020-05-19 DIAGNOSIS — Z1231 Encounter for screening mammogram for malignant neoplasm of breast: Secondary | ICD-10-CM

## 2020-06-14 ENCOUNTER — Ambulatory Visit (HOSPITAL_COMMUNITY)
Admission: RE | Admit: 2020-06-14 | Discharge: 2020-06-14 | Disposition: A | Discharge status: Home / Self Care | Payer: Medicare Other | Source: Ambulatory Visit | Attending: Family Medicine | Admitting: Family Medicine

## 2020-06-14 ENCOUNTER — Other Ambulatory Visit: Payer: Self-pay

## 2020-06-14 DIAGNOSIS — Z1231 Encounter for screening mammogram for malignant neoplasm of breast: Secondary | ICD-10-CM | POA: Insufficient documentation

## 2020-09-07 ENCOUNTER — Other Ambulatory Visit: Payer: Self-pay | Admitting: Family Medicine

## 2020-09-07 ENCOUNTER — Other Ambulatory Visit (HOSPITAL_COMMUNITY): Payer: Self-pay | Admitting: Family Medicine

## 2020-09-07 DIAGNOSIS — M7989 Other specified soft tissue disorders: Secondary | ICD-10-CM

## 2020-09-07 DIAGNOSIS — M25511 Pain in right shoulder: Secondary | ICD-10-CM

## 2020-09-10 ENCOUNTER — Ambulatory Visit (HOSPITAL_COMMUNITY): Admission: RE | Admit: 2020-09-10 | Payer: Medicare Other | Source: Ambulatory Visit

## 2020-09-13 ENCOUNTER — Ambulatory Visit (HOSPITAL_COMMUNITY): Admission: RE | Admit: 2020-09-13 | Payer: Medicare Other | Source: Ambulatory Visit

## 2020-09-13 ENCOUNTER — Encounter (HOSPITAL_COMMUNITY): Payer: Self-pay

## 2020-09-16 ENCOUNTER — Other Ambulatory Visit: Payer: Medicare Other | Admitting: Advanced Practice Midwife

## 2020-09-24 ENCOUNTER — Other Ambulatory Visit: Payer: Self-pay

## 2020-09-24 ENCOUNTER — Ambulatory Visit (HOSPITAL_COMMUNITY)
Admission: RE | Admit: 2020-09-24 | Discharge: 2020-09-24 | Disposition: A | Discharge status: Home / Self Care | Payer: Medicare Other | Source: Ambulatory Visit | Attending: Family Medicine | Admitting: Family Medicine

## 2020-09-24 DIAGNOSIS — M7989 Other specified soft tissue disorders: Secondary | ICD-10-CM | POA: Insufficient documentation

## 2020-10-06 ENCOUNTER — Encounter: Payer: Self-pay | Admitting: Advanced Practice Midwife

## 2020-10-06 ENCOUNTER — Other Ambulatory Visit (HOSPITAL_COMMUNITY)
Admission: RE | Admit: 2020-10-06 | Discharge: 2020-10-06 | Disposition: A | Discharge status: Home / Self Care | Payer: Medicare Other | Source: Ambulatory Visit | Attending: Advanced Practice Midwife | Admitting: Advanced Practice Midwife

## 2020-10-06 ENCOUNTER — Other Ambulatory Visit: Payer: Self-pay

## 2020-10-06 ENCOUNTER — Ambulatory Visit (INDEPENDENT_AMBULATORY_CARE_PROVIDER_SITE_OTHER): Payer: Medicare Other | Admitting: Advanced Practice Midwife

## 2020-10-06 VITALS — BP 112/80 | HR 102 | Ht 65.2 in | Wt 154.0 lb

## 2020-10-06 DIAGNOSIS — Z01419 Encounter for gynecological examination (general) (routine) without abnormal findings: Secondary | ICD-10-CM

## 2020-10-06 DIAGNOSIS — Z124 Encounter for screening for malignant neoplasm of cervix: Secondary | ICD-10-CM | POA: Diagnosis not present

## 2020-10-06 DIAGNOSIS — Z1151 Encounter for screening for human papillomavirus (HPV): Secondary | ICD-10-CM | POA: Insufficient documentation

## 2020-10-06 NOTE — Progress Notes (Signed)
Stacey Macdonald 55 y.o.  Vitals:   10/06/20 1533  BP: 112/80  Pulse: (!) 102     Filed Weights   10/06/20 1533  Weight: 154 lb (69.9 kg)    Past Medical History: Past Medical History:  Diagnosis Date   Adjustment disorder with depressed mood    Agoraphobia with panic attacks    GERD (gastroesophageal reflux disease)    Heart palpitations    Insomnia    Nicotine dependence    Pure hyperglyceridemia    Tachycardia     Past Surgical History: Past Surgical History:  Procedure Laterality Date   stage 2 precancer cells removal  1990's   TUBAL LIGATION     tubal surgeries   2000    Family History: Family History  Problem Relation Age of Onset   Heart attack Father    Hypertension Maternal Grandmother    Other Maternal Grandfather        bleeding ulcer    Social History: Social History   Tobacco Use   Smoking status: Former    Types: Cigarettes    Quit date: 02/23/2008    Years since quitting: 12.6   Smokeless tobacco: Never  Vaping Use   Vaping Use: Never used  Substance Use Topics   Alcohol use: Not Currently   Drug use: Not Currently    Types: Marijuana    Allergies:  Allergies  Allergen Reactions   Antihistamines, Chlorpheniramine-Type     Allergic to antihistamines too   Morphine And Related       Current Outpatient Medications:    alprazolam (XANAX) 2 MG tablet, 1 mg. Up to five times a day, Disp: , Rfl:    atenolol (TENORMIN) 25 MG tablet, TAKE (1/2) TABLET BY MOUTH TWICE DAILY., Disp: 90 tablet, Rfl: 0   doxepin (SINEQUAN) 10 MG capsule, Take 10 mg by mouth. Take up to 5 pills at bedtime, Disp: , Rfl:    ibuprofen (ADVIL) 100 MG tablet, Take 200 mg by mouth every 6 (six) hours as needed for fever., Disp: , Rfl:    acetaminophen (TYLENOL) 500 MG tablet, Take 500 mg by mouth every 6 (six) hours as needed. (Patient not taking: Reported on 10/06/2020), Disp: , Rfl:    omeprazole (PRILOSEC) 20 MG capsule, Take 20 mg by mouth daily. (Patient  not taking: Reported on 10/06/2020), Disp: , Rfl:    sulfamethoxazole-trimethoprim (BACTRIM DS) 800-160 MG tablet, Take 1 tablet by mouth 2 (two) times daily. Take 1 bid, Disp: 14 tablet, Rfl: 0  History of Present Illness: Here for pap and physical  Last pap around 2016.   Postmenopausal. PCP ([facility])watches labs .  Mammogram 06/14/20, normal.  Has not had a colonoscopy, but sees PCP in 2 weeks and will ask for referral then. Many years postmenopausal. About once a week since 2016 (at least) has some brown discharge, unsure if it's blood. Wears a panty liner. Denies vaginal itch/irritation.  Last IC 9/21.   Review of Systems   Patient denies any headaches, blurred vision, shortness of breath, chest pain, abdominal pain, problems with bowel movements, urination   Physical Exam: General:  Well developed, well nourished, no acute distress Skin:  Warm and dry Neck:  Midline trachea, normal thyroid Lungs; Clear to auscultation bilaterally Breast:  No dominant palpable mass, retraction, or nipple discharge Cardiovascular: Regular rate and rhythm Abdomen:  Soft, non tender, no hepatosplenomegaly Pelvic:  External genitalia:  white areas on labia minora from 9 o'clock, up around clitoral hood, and  down to 3 o'clock.  No plaques/raised areas.  Pt unaware of this, denies itching.  ? Lichen sclerosis. Vagina:  small excoriated looking area on left side of vagina, friable and bleeds when touched.  The cervix is bulbous.  Uterus is felt to be normal size, shape, and contour.  No adnexal masses or tenderness noted.  Extremities:  No swelling or varicosities noted Psych:  No mood changes.     Impression: Post menopausal bleeding:  ? Vaginal lesion ? Lichen sclerosis     Plan: Return for appt w/MD--will schedule on a day that I am also here and can co-exam per pt request.

## 2020-10-08 LAB — CYTOLOGY - PAP
Chlamydia: NEGATIVE
Comment: NEGATIVE
Comment: NEGATIVE
Comment: NORMAL
Diagnosis: NEGATIVE
High risk HPV: NEGATIVE
Neisseria Gonorrhea: NEGATIVE

## 2020-10-14 ENCOUNTER — Telehealth (HOSPITAL_COMMUNITY): Payer: Self-pay | Admitting: Advanced Practice Midwife

## 2020-10-16 NOTE — Telephone Encounter (Signed)
errror

## 2020-12-02 ENCOUNTER — Ambulatory Visit: Payer: Medicare Other | Admitting: Obstetrics & Gynecology

## 2021-04-06 ENCOUNTER — Other Ambulatory Visit: Payer: Self-pay

## 2021-04-06 ENCOUNTER — Other Ambulatory Visit (HOSPITAL_COMMUNITY): Payer: Self-pay | Admitting: Family Medicine

## 2021-04-06 ENCOUNTER — Ambulatory Visit (HOSPITAL_COMMUNITY)
Admission: RE | Admit: 2021-04-06 | Discharge: 2021-04-06 | Disposition: A | Discharge status: Home / Self Care | Payer: Medicare HMO | Source: Ambulatory Visit | Attending: Family Medicine | Admitting: Family Medicine

## 2021-04-06 DIAGNOSIS — M545 Low back pain, unspecified: Secondary | ICD-10-CM

## 2021-04-18 ENCOUNTER — Other Ambulatory Visit (HOSPITAL_COMMUNITY): Payer: Self-pay | Admitting: Family Medicine

## 2021-04-18 DIAGNOSIS — Z780 Asymptomatic menopausal state: Secondary | ICD-10-CM

## 2021-04-20 ENCOUNTER — Encounter (INDEPENDENT_AMBULATORY_CARE_PROVIDER_SITE_OTHER): Payer: Self-pay | Admitting: *Deleted

## 2021-04-20 ENCOUNTER — Other Ambulatory Visit (HOSPITAL_COMMUNITY): Payer: Medicare HMO

## 2021-04-28 ENCOUNTER — Telehealth: Payer: Medicare HMO | Admitting: Physician Assistant

## 2021-04-28 DIAGNOSIS — K047 Periapical abscess without sinus: Secondary | ICD-10-CM

## 2021-04-28 DIAGNOSIS — K0889 Other specified disorders of teeth and supporting structures: Secondary | ICD-10-CM

## 2021-04-28 MED ORDER — IBUPROFEN 600 MG PO TABS: 600.0000 mg | ORAL_TABLET | Freq: Three times a day (TID) | ORAL | 0 refills | Status: DC | PRN

## 2021-04-28 MED ORDER — AMOXICILLIN 500 MG PO CAPS: 500.0000 mg | ORAL_CAPSULE | Freq: Three times a day (TID) | ORAL | 0 refills | Status: AC

## 2021-04-28 NOTE — Progress Notes (Signed)

## 2021-06-21 ENCOUNTER — Other Ambulatory Visit (HOSPITAL_COMMUNITY): Payer: Self-pay | Admitting: Family Medicine

## 2021-06-21 ENCOUNTER — Telehealth: Payer: Medicare HMO | Admitting: Physician Assistant

## 2021-06-21 DIAGNOSIS — Z1231 Encounter for screening mammogram for malignant neoplasm of breast: Secondary | ICD-10-CM

## 2021-06-21 DIAGNOSIS — K047 Periapical abscess without sinus: Secondary | ICD-10-CM | POA: Diagnosis not present

## 2021-06-21 MED ORDER — NAPROXEN 500 MG PO TABS: 500.0000 mg | ORAL_TABLET | Freq: Two times a day (BID) | ORAL | 0 refills | Status: DC

## 2021-06-21 MED ORDER — AMOXICILLIN 500 MG PO CAPS: 500.0000 mg | ORAL_CAPSULE | Freq: Three times a day (TID) | ORAL | 0 refills | Status: AC

## 2021-06-21 MED ORDER — CLINDAMYCIN HCL 300 MG PO CAPS: 300.0000 mg | ORAL_CAPSULE | Freq: Three times a day (TID) | ORAL | 0 refills | Status: DC

## 2021-06-21 NOTE — Progress Notes (Signed)
I have spent 5 minutes in review of e-visit questionnaire, review and updating patient chart, medical decision making and response to patient.   Soni Kegel Cody Devlin Brink, PA-C    

## 2021-06-21 NOTE — Addendum Note (Signed)
Addended by: Waldon Merl on: 06/21/2021 04:46 PM ? ? Modules accepted: Orders ? ?

## 2021-06-21 NOTE — Progress Notes (Signed)
E-Visit for Dental Pain ? ?We are sorry that you are not feeling well.  Here is how we plan to help! ? ?Based on what you have shared with me in the questionnaire, it sounds like you have a dental infection at site of a broken tooth. ? ?I have prescribed naprosyn 500mg  2 times per day for 7 days for discomfort and Clindamycin 300mg  3 times per day for 7 days ? ?It is imperative that you see a dentist within 10 days of this eVisit to determine the cause of the dental pain and be sure it is adequately treated ? ?A toothache or tooth pain is caused when the nerve in the root of a tooth or surrounding a tooth is irritated. Dental (tooth) infection, decay, injury, or loss of a tooth are the most common causes of dental pain. Pain may also occur after an extraction (tooth is pulled out). Pain sometimes originates from other areas and radiates to the jaw, thus appearing to be tooth pain.Bacteria growing inside your mouth can contribute to gum disease and dental decay, both of which can cause pain. A toothache occurs from inflammation of the central portion of the tooth called pulp. The pulp contains nerve endings that are very sensitive to pain. Inflammation to the pulp or pulpitis may be caused by dental cavities, trauma, and infection.  ? ? ?HOME CARE:  ? ?For toothaches: ?Over-the-counter pain medications such as acetaminophen or ibuprofen may be used. Take these as directed on the package while you arrange for a dental appointment. ?Avoid very cold or hot foods, because they may make the pain worse. ?You may get relief from biting on a cotton ball soaked in oil of cloves. You can get oil of cloves at most drug stores. ? ?For jaw pain: ? Aspirin may be helpful for problems in the joint of the jaw in adults. ?If pain happens every time you open your mouth widely, the temporomandibular joint (TMJ) may be the source of the pain. Yawning or taking a large bite of food may worsen the pain. An appointment with your doctor or  dentist will help you find the cause. ?  ? ? ?GET HELP RIGHT AWAY IF: ? ?You have a high fever or chills ?If you have had a recent head or face injury and develop headache, light headedness, nausea, vomiting, or other symptoms that concern you after an injury to your face or mouth, you could have a more serious injury in addition to your dental injury. ?A facial rash associated with a toothache: This condition may improve with medication. Contact your doctor for them to decide what is appropriate. ?Any jaw pain occurring with chest pain: Although jaw pain is most commonly caused by dental disease, it is sometimes referred pain from other areas. People with heart disease, especially people who have had stents placed, people with diabetes, or those who have had heart surgery may have jaw pain as a symptom of heart attack or angina. If your jaw or tooth pain is associated with lightheadedness, sweating, or shortness of breath, you should see a doctor as soon as possible. ?Trouble swallowing or excessive pain or bleeding from gums: If you have a history of a weakened immune system, diabetes, or steroid use, you may be more susceptible to infections. Infections can often be more severe and extensive or caused by unusual organisms. Dental and gum infections in people with these conditions may require more aggressive treatment. An abscess may need draining or IV antibiotics, for  example. ? ?MAKE SURE YOU  ? ?Understand these instructions. ?Will watch your condition. ?Will get help right away if you are not doing well or get worse. ? ?Thank you for choosing an e-visit. ? ?Your e-visit answers were reviewed by a board certified advanced clinical practitioner to complete your personal care plan. Depending upon the condition, your plan could have included both over the counter or prescription medications. ? ?Please review your pharmacy choice. Make sure the pharmacy is open so you can pick up prescription now. If there is a  problem, you may contact your provider through [employer] and have the prescription routed to another pharmacy.  Your safety is important to [place]. If you have drug allergies check your prescription carefully.  ? ?For the next 24 hours you can use MyChart to ask questions about today's visit, request a non-urgent call back, or ask for a work or school excuse. ?You will get an email in the next two days asking about your experience. I hope that your e-visit has been valuable and will speed your recovery. ? ?

## 2021-07-04 ENCOUNTER — Ambulatory Visit (HOSPITAL_COMMUNITY)
Admission: RE | Admit: 2021-07-04 | Discharge: 2021-07-04 | Disposition: A | Discharge status: Home / Self Care | Payer: Medicare HMO | Source: Ambulatory Visit | Attending: Family Medicine | Admitting: Family Medicine

## 2021-07-04 DIAGNOSIS — Z1231 Encounter for screening mammogram for malignant neoplasm of breast: Secondary | ICD-10-CM | POA: Diagnosis present

## 2021-10-21 ENCOUNTER — Encounter (INDEPENDENT_AMBULATORY_CARE_PROVIDER_SITE_OTHER): Payer: Self-pay | Admitting: *Deleted

## 2022-02-01 LAB — LAB REPORT - SCANNED: EGFR: 82

## 2022-02-17 LAB — LAB REPORT - SCANNED: HM Hepatitis Screen: NEGATIVE

## 2022-05-13 LAB — LAB REPORT - SCANNED: EGFR: 92

## 2022-06-06 ENCOUNTER — Other Ambulatory Visit (HOSPITAL_COMMUNITY): Payer: Self-pay | Admitting: Family Medicine

## 2022-06-06 DIAGNOSIS — Z1231 Encounter for screening mammogram for malignant neoplasm of breast: Secondary | ICD-10-CM

## 2022-06-20 ENCOUNTER — Ambulatory Visit (INDEPENDENT_AMBULATORY_CARE_PROVIDER_SITE_OTHER): Payer: Medicare HMO | Admitting: Internal Medicine

## 2022-06-20 ENCOUNTER — Encounter: Payer: Self-pay | Admitting: Internal Medicine

## 2022-06-20 VITALS — BP 100/66 | HR 87 | Ht 65.5 in | Wt 149.4 lb

## 2022-06-20 DIAGNOSIS — F410 Panic disorder [episodic paroxysmal anxiety] without agoraphobia: Secondary | ICD-10-CM

## 2022-06-20 DIAGNOSIS — E785 Hyperlipidemia, unspecified: Secondary | ICD-10-CM | POA: Diagnosis not present

## 2022-06-20 DIAGNOSIS — E559 Vitamin D deficiency, unspecified: Secondary | ICD-10-CM

## 2022-06-20 DIAGNOSIS — R000 Tachycardia, unspecified: Secondary | ICD-10-CM

## 2022-06-20 DIAGNOSIS — Z1211 Encounter for screening for malignant neoplasm of colon: Secondary | ICD-10-CM

## 2022-06-20 DIAGNOSIS — M810 Age-related osteoporosis without current pathological fracture: Secondary | ICD-10-CM

## 2022-06-20 DIAGNOSIS — F411 Generalized anxiety disorder: Secondary | ICD-10-CM

## 2022-06-20 DIAGNOSIS — E039 Hypothyroidism, unspecified: Secondary | ICD-10-CM

## 2022-06-20 DIAGNOSIS — Z79899 Other long term (current) drug therapy: Secondary | ICD-10-CM

## 2022-06-20 DIAGNOSIS — E538 Deficiency of other specified B group vitamins: Secondary | ICD-10-CM

## 2022-06-20 DIAGNOSIS — Z0001 Encounter for general adult medical examination with abnormal findings: Secondary | ICD-10-CM

## 2022-06-20 DIAGNOSIS — K219 Gastro-esophageal reflux disease without esophagitis: Secondary | ICD-10-CM

## 2022-06-20 DIAGNOSIS — F419 Anxiety disorder, unspecified: Secondary | ICD-10-CM | POA: Diagnosis not present

## 2022-06-20 DIAGNOSIS — R002 Palpitations: Secondary | ICD-10-CM

## 2022-06-20 DIAGNOSIS — Z131 Encounter for screening for diabetes mellitus: Secondary | ICD-10-CM

## 2022-06-20 NOTE — Assessment & Plan Note (Signed)
She states that recent labs were concerning for potential hypothyroidism.  These labs are not available for review today. -Repeat TFTs ordered today -Records from her previous provider have been requested

## 2022-06-20 NOTE — Assessment & Plan Note (Signed)
She reports a history of osteoporosis.  No previous DEXA scan on file for review.  Currently on daily vitamin D supplementation. -Repeat vitamin D level ordered today -Records requested from her previous provider

## 2022-06-20 NOTE — Assessment & Plan Note (Signed)
Presenting today to establish care.  Available records and labs have been reviewed. -Repeat baseline lab as ordered today -Records from her previous provider have been requested -Cologuard ordered for colon cancer screening -Psychiatry referral placed -We will tentatively plan for follow-up in 3 months

## 2022-06-20 NOTE — Assessment & Plan Note (Signed)
Cologuard ordered today °

## 2022-06-20 NOTE — Assessment & Plan Note (Signed)
She endorses a history of vitamin B12 deficiency and is on daily oral supplementation. -Repeat vitamin B12 level ordered today

## 2022-06-20 NOTE — Assessment & Plan Note (Signed)
Currently prescribed atorvastatin 20 mg daily.  Repeat lipid panel ordered today. 

## 2022-06-20 NOTE — Assessment & Plan Note (Signed)
History of vitamin D deficiency.  Currently on daily vitamin D supplementation. -Repeat vitamin D level ordered today

## 2022-06-20 NOTE — Assessment & Plan Note (Signed)
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  She is currently prescribed Xanax 1 mg 5 times daily as well as doxepin 10 mg with at the discretion to take up to 5 tablets nightly.  This has most recently been managed by her PCP.  She states that she previously followed with psychiatry before her provider retired.  She states that she has tried numerous medications previously. -No medication changes today -I expressed my concern regarding chronic benzodiazepine use, particularly at a frequency of 5 times daily.  I recommended that she establish care with psychiatry.  She is in agreement with this plan.  In the interim, will refill Xanax when needed.

## 2022-06-20 NOTE — Assessment & Plan Note (Signed)
She is prescribed atenolol 12.5 mg twice daily for treatment of palpitations.  Asymptomatic currently. -No medication changes today

## 2022-06-20 NOTE — Progress Notes (Signed)
New Patient Office Visit  Subjective    Patient ID: Stacey Macdonald, female    DOB: 10-04-1965  Age: 56 y.o. MRN: [id number]  CC:  Chief Complaint  Patient presents with   Establish Care    HPI Stacey Macdonald presents to establish care.  She is a 57 year old female who endorses a past medical history significant for anxiety with agoraphobia, osteoporosis, tachycardia syndrome, HLD, vitamin B12 deficiency, vitamin D deficiency, and potential hypothyroid.  She has most recently followed by Dr. Sudie Bailey.  Stacey Macdonald reports feeling well today.  She is asymptomatic and has no acute concerns to discuss aside from desiring to establish care.  She is currently disabled due to a history of debilitating anxiety and chronic back issues.  She endorses former tobacco use, quitting in 2010, but denies alcohol and illicit drug use.  Her family medical history is significant for CAD, HTN, and COPD.  Chronic medical conditions and outstanding preventative care items discussed today are individually addressed A/P below.  Outpatient Encounter Medications as of 06/20/2022  Medication Sig   alprazolam (XANAX) 2 MG tablet 1 mg. Up to five times a day   atenolol (TENORMIN) 25 MG tablet TAKE (1/2) TABLET BY MOUTH TWICE DAILY.   atorvastatin (LIPITOR) 20 MG tablet Take 20 mg by mouth daily.   Cholecalciferol (VITAMIN D-3) 25 MCG (1000 UT) CAPS Take 1 capsule by mouth daily.   doxepin (SINEQUAN) 10 MG capsule Take 10 mg by mouth. Take up to 5 pills at bedtime   ibuprofen (ADVIL) 600 MG tablet Take 1 tablet (600 mg total) by mouth every 8 (eight) hours as needed.   Multiple Vitamins-Minerals (CENTRUM SILVER 50+WOMEN PO) Take 1 tablet by mouth daily.   omeprazole (PRILOSEC) 20 MG capsule Take 20 mg by mouth daily.   vitamin B-12 (CYANOCOBALAMIN) 500 MCG tablet Take 500 mcg by mouth daily.   acetaminophen (TYLENOL) 500 MG tablet Take 500 mg by mouth every 6 (six) hours as needed. (Patient not taking:  Reported on 06/20/2022)   No facility-administered encounter medications on file as of 06/20/2022.    Past Medical History:  Diagnosis Date   Adjustment disorder with depressed mood    Agoraphobia with panic attacks    GERD (gastroesophageal reflux disease)    Heart palpitations    Insomnia    Nicotine dependence    Pure hyperglyceridemia    Tachycardia     Past Surgical History:  Procedure Laterality Date   stage 2 precancer cells removal  1990's   TUBAL LIGATION     tubal surgeries   2000    Family History  Problem Relation Age of Onset   Heart attack Father    Hypertension Maternal Grandmother    Other Maternal Grandfather        bleeding ulcer    Social History   Socioeconomic History   Marital status: Divorced    Spouse name: Not on file   Number of children: Not on file   Years of education: Not on file   Highest education level: Not on file  Occupational History   Not on file  Tobacco Use   Smoking status: Former    Types: Cigarettes    Quit date: 02/23/2008    Years since quitting: 14.3   Smokeless tobacco: Never  Vaping Use   Vaping Use: Never used  Substance and Sexual Activity   Alcohol use: Not Currently   Drug use: Not Currently    Types: Marijuana  Sexual activity: Not Currently    Birth control/protection: Surgical, Post-menopausal    Comment: tubal  Other Topics Concern   Not on file  Social History Narrative   Not on file   Social Determinants of Health   Financial Resource Strain: Low Risk  (10/06/2020)   Overall Financial Resource Strain (CARDIA)    Difficulty of Paying Living Expenses: Not hard at all  Food Insecurity: No Food Insecurity (10/06/2020)   Hunger Vital Sign    Worried About Running Out of Food in the Last Year: Never true    Ran Out of Food in the Last Year: Never true  Transportation Needs: No Transportation Needs (10/06/2020)   PRAPARE - Administrator, Civil Service (Medical): No    Lack of  Transportation (Non-Medical): No  Physical Activity: Inactive (10/06/2020)   Exercise Vital Sign    Days of Exercise per Week: 0 days    Minutes of Exercise per Session: 0 min  Stress: Stress Concern Present (10/06/2020)   [employer] of Occupational Health - Occupational Stress Questionnaire    Feeling of Stress : To some extent  Social Connections: Socially Isolated (10/06/2020)   Social Connection and Isolation Panel [NHANES]    Frequency of Communication with Friends and Family: Once a week    Frequency of Social Gatherings with Friends and Family: Once a week    Attends Religious Services: 1 to 4 times per year    Active Member of [employer] or Organizations: No    Attends Banker Meetings: Never    Marital Status: Divorced  Catering manager Violence: Not At Risk (10/06/2020)   Humiliation, Afraid, Rape, and Kick questionnaire    Fear of Current or Ex-Partner: No    Emotionally Abused: No    Physically Abused: No    Sexually Abused: No    Review of Systems  Constitutional:  Negative for chills and fever.  HENT:  Negative for sore throat.   Respiratory:  Negative for cough and shortness of breath.   Cardiovascular:  Negative for chest pain, palpitations and leg swelling.  Gastrointestinal:  Negative for abdominal pain, blood in stool, constipation, diarrhea, nausea and vomiting.  Genitourinary:  Negative for dysuria and hematuria.  Musculoskeletal:  Negative for myalgias.  Skin:  Negative for itching and rash.  Neurological:  Negative for dizziness and headaches.  Psychiatric/Behavioral:  Negative for depression and suicidal ideas.    Objective    BP 100/66   Pulse 87   Ht 5' 5.5" (1.664 m)   Wt 149 lb 6.4 oz (67.8 kg)   LMP 07/08/2013 Comment: perimenopausal  SpO2 92%   BMI 24.48 kg/m   Physical Exam Vitals reviewed.  Constitutional:      General: She is not in acute distress.    Appearance: Normal appearance. She is not toxic-appearing.  HENT:      Head: Normocephalic and atraumatic.     Right Ear: External ear normal.     Left Ear: External ear normal.     Nose: Nose normal. No congestion or rhinorrhea.     Mouth/Throat:     Mouth: Mucous membranes are moist.     Pharynx: Oropharynx is clear. No oropharyngeal exudate or posterior oropharyngeal erythema.  Eyes:     General: No scleral icterus.    Extraocular Movements: Extraocular movements intact.     Conjunctiva/sclera: Conjunctivae normal.     Pupils: Pupils are equal, round, and reactive to light.  Cardiovascular:     Rate  and Rhythm: Normal rate and regular rhythm.     Pulses: Normal pulses.     Heart sounds: Normal heart sounds. No murmur heard.    No friction rub. No gallop.  Pulmonary:     Effort: Pulmonary effort is normal.     Breath sounds: Normal breath sounds. No wheezing, rhonchi or rales.  Abdominal:     General: Abdomen is flat. Bowel sounds are normal. There is no distension.     Palpations: Abdomen is soft.     Tenderness: There is no abdominal tenderness.  Musculoskeletal:        General: No swelling. Normal range of motion.     Cervical back: Normal range of motion.     Right lower leg: No edema.     Left lower leg: No edema.  Lymphadenopathy:     Cervical: No cervical adenopathy.  Skin:    General: Skin is warm and dry.     Capillary Refill: Capillary refill takes less than 2 seconds.     Coloration: Skin is not jaundiced.  Neurological:     General: No focal deficit present.     Mental Status: She is alert and oriented to person, place, and time.  Psychiatric:        Mood and Affect: Mood normal.        Behavior: Behavior normal.     Assessment & Plan:   Problem List Items Addressed This Visit       Hypothyroidism determined by thyroid function test    She states that recent labs were concerning for potential hypothyroidism.  These labs are not available for review today. -Repeat TFTs ordered today -Records from her previous provider  have been requested      Osteoporosis    She reports a history of osteoporosis.  No previous DEXA scan on file for review.  Currently on daily vitamin D supplementation. 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  She is currently prescribed Xanax 1 mg 5 times daily as well as doxepin 10 mg with at the discretion to take up to 5 tablets nightly.  This has most recently been managed by her PCP.  She states that she previously followed with psychiatry before her provider retired.  She states that she has tried numerous medications previously. -No medication changes today -I expressed my concern regarding chronic benzodiazepine use, particularly at a frequency of 5 times daily.  I recommended that she establish care with psychiatry.  She is in agreement with this plan.  In the interim, will refill Xanax when needed.      Palpitations    She is prescribed atenolol 12.5 mg twice daily for treatment of palpitations.  Asymptomatic currently. -No medication changes today      Hyperlipidemia    Currently prescribed atorvastatin 20 mg daily. -Repeat lipid panel ordered today      Vitamin B12 deficiency    She endorses a history of vitamin B12 deficiency and is on daily oral supplementation. -Repeat vitamin B12 level ordered today      Vitamin D deficiency    History of vitamin D deficiency.  Currently on daily vitamin D supplementation. -Repeat vitamin D level ordered today      Colon cancer screening - Primary    Cologuard  ordered today      Encounter for general adult medical examination with abnormal findings    Presenting today to establish care.  Available records and labs have been reviewed. -Repeat baseline lab as ordered today -Records from her  previous provider have been requested -Cologuard ordered for colon cancer screening -Psychiatry referral placed -We will tentatively plan for follow-up in 3 months      Return in about 3 months (around 09/20/2022).   Billie Lade, MD

## 2022-06-20 NOTE — Patient Instructions (Signed)
It was a pleasure to see you today.  Thank you for giving [place] the opportunity to be involved in your care.  Below is a brief recap of your visit and next steps.  We will plan to see you again in 3 months.  Summary You have established care today Basic labs have been ordered Cologuard ordered for colon cancer screening We will request records from Dr. Michelle Nasuti office Follow up in 3 months

## 2022-07-05 ENCOUNTER — Other Ambulatory Visit: Payer: Self-pay | Admitting: Internal Medicine

## 2022-07-05 DIAGNOSIS — R195 Other fecal abnormalities: Secondary | ICD-10-CM

## 2022-07-05 LAB — COLOGUARD: COLOGUARD: POSITIVE — AB

## 2022-07-13 ENCOUNTER — Telehealth: Payer: Self-pay | Admitting: *Deleted

## 2022-07-13 NOTE — Telephone Encounter (Signed)
Pt called in. Epic referral sent to [place] 5/22 by PCP. She was wanting to make an appointment. Reports she has been trying to call in and can't get anyone. She can be reached at [phone].

## 2022-07-17 ENCOUNTER — Encounter (HOSPITAL_COMMUNITY): Payer: Self-pay

## 2022-07-17 ENCOUNTER — Ambulatory Visit (HOSPITAL_COMMUNITY)
Admission: RE | Admit: 2022-07-17 | Discharge: 2022-07-17 | Disposition: A | Discharge status: Home / Self Care | Payer: Medicare HMO | Source: Ambulatory Visit | Attending: Family Medicine | Admitting: Family Medicine

## 2022-07-17 DIAGNOSIS — Z1231 Encounter for screening mammogram for malignant neoplasm of breast: Secondary | ICD-10-CM | POA: Diagnosis present

## 2022-07-31 ENCOUNTER — Encounter: Payer: Self-pay | Admitting: Gastroenterology

## 2022-07-31 ENCOUNTER — Ambulatory Visit: Payer: Medicare HMO | Admitting: Gastroenterology

## 2022-07-31 ENCOUNTER — Encounter: Payer: Self-pay | Admitting: *Deleted

## 2022-07-31 ENCOUNTER — Telehealth: Payer: Self-pay | Admitting: *Deleted

## 2022-07-31 VITALS — BP 114/76 | HR 80 | Temp 98.1°F | Ht 65.5 in | Wt 150.4 lb

## 2022-07-31 DIAGNOSIS — K219 Gastro-esophageal reflux disease without esophagitis: Secondary | ICD-10-CM | POA: Diagnosis not present

## 2022-07-31 DIAGNOSIS — R195 Other fecal abnormalities: Secondary | ICD-10-CM | POA: Diagnosis not present

## 2022-07-31 NOTE — Patient Instructions (Addendum)
We are scheduling you for a colonoscopy in the near future with Dr. Jena Gauss.  You will receive separate detailed written instructions regarding your prep.  Continue omeprazole 20 mg once daily.  You may take famotidine 20 mg once daily as needed for breakthrough symptoms.  Follow a GERD diet:  Avoid fried, fatty, greasy, spicy, citrus foods. Avoid caffeine and carbonated beverages. Avoid chocolate. Try eating 4-6 small meals a day rather than 3 large meals. Do not eat within 3 hours of laying down. Prop head of bed up on wood or bricks to create a 6 inch incline.  It was a pleasure to see you today. I want to create trusting relationships with patients. If you receive a survey regarding your visit,  I greatly appreciate you taking time to fill this out on paper or through your MyChart. I value your feedback.  Brooke Bonito, MSN, FNP-BC, AGACNP-BC [facility]

## 2022-07-31 NOTE — Progress Notes (Signed)
GI Office Note    Referring Provider: Billie Lade, MD Primary Care Physician:  Billie Lade, MD  Primary Gastroenterologist: Gerrit Friends.Rourk, MD  Chief Complaint   Chief Complaint  Patient presents with   Colonoscopy    Positive cologuard, never had a colonoscopy.   History of Present Illness   Stacey Macdonald is a 57 y.o. female presenting today at the request of Durwin Nora Lucina Mellow, MD for colonoscopy given positive Cologuard.  Positive Cologuard 06/27/2022.  Labs ordered 06/20/2022 including CBC, CMP, lipid panel, A1c, TSH, vitamin D, and B12/folate panel which have not been completed.  Today: No family history of colon cancer. Sister has had some benign polyps removed.   Sometimes constipated, has gone 3 times today but has been nervous. Leans more toward constipation than diarrhea usually. No straining.   No melena or brbpr. She reports + FOBT in cologaurd. Does have some lower abdominal pain and due to see GYN regarding possible hysterectomy and pelvic pain. Does wear a panty liner and has some mild vaginal spotting. No unintentional weight loss. AT baseline she does not eat a lot (eats too much of wrong thing or doesn't eat much).  Does report a B12 deficiency and does have fatigue (chronic). Does have some acid reflux depending on her mood and anything acidic does bother her. She used to burp a lot and relieves the bloating sensation. Takes omeprazole 20 mg once daily.   Denies any nausea, vomiting, or dysphagia.  Denies any early satiety or any upper abdominal pain.   Current Outpatient Medications  Medication Sig Dispense Refill   ALPRAZolam (XANAX) 1 MG tablet 1 mg. Up to five times a day     atenolol (TENORMIN) 25 MG tablet TAKE (1/2) TABLET BY MOUTH TWICE DAILY. (Patient taking differently: 12.5 mg daily.) 90 tablet 0   atorvastatin (LIPITOR) 20 MG tablet Take 20 mg by mouth daily.     Cholecalciferol (VITAMIN D-3) 25 MCG (1000 UT) CAPS Take 1 capsule by  mouth daily.     doxepin (SINEQUAN) 10 MG capsule Take 10 mg by mouth. Take up to 5 pills a day     ibuprofen (ADVIL) 600 MG tablet Take 1 tablet (600 mg total) by mouth every 8 (eight) hours as needed. 30 tablet 0   Multiple Vitamins-Minerals (CENTRUM SILVER 50+WOMEN PO) Take 1 tablet by mouth daily.     omeprazole (PRILOSEC) 20 MG capsule Take 20 mg by mouth daily.     vitamin B-12 (CYANOCOBALAMIN) 500 MCG tablet Take 500 mcg by mouth daily.     No current facility-administered medications for this visit.    Past Medical History:  Diagnosis Date   Adjustment disorder with depressed mood    Agoraphobia with panic attacks    GERD (gastroesophageal reflux disease)    Heart palpitations    Insomnia    Nicotine dependence    Pure hyperglyceridemia    Tachycardia     Past Surgical History:  Procedure Laterality Date   stage 2 precancer cells removal  1990's   TUBAL LIGATION     tubal surgeries   2000    Family History  Problem Relation Age of Onset   Heart attack Father    Hypertension Maternal Grandmother    Other Maternal Grandfather        bleeding ulcer    Allergies as of 07/31/2022 - Review Complete 07/31/2022  Allergen Reaction Noted   Antihistamines, chlorpheniramine-type  02/22/2018   Morphine and  codeine  02/22/2018    Social History   Socioeconomic History   Marital status: Divorced    Spouse name: Not on file   Number of children: Not on file   Years of education: Not on file   Highest education level: Not on file  Occupational History   Not on file  Tobacco Use   Smoking status: Former    Types: Cigarettes    Quit date: 02/23/2008    Years since quitting: 14.4   Smokeless tobacco: Never  Vaping Use   Vaping Use: Never used  Substance and Sexual Activity   Alcohol use: Not Currently   Drug use: Not Currently    Types: Marijuana   Sexual activity: Not Currently    Birth control/protection: Surgical, Post-menopausal    Comment: tubal  Other  Topics Concern   Not on file  Social History Narrative   Not on file   Social Determinants of Health   Financial Resource Strain: Low Risk  (10/06/2020)   Overall Financial Resource Strain (CARDIA)    Difficulty of Paying Living Expenses: Not hard at all  Food Insecurity: No Food Insecurity (10/06/2020)   Hunger Vital Sign    Worried About Running Out of Food in the Last Year: Never true    Ran Out of Food in the Last Year: Never true  Transportation Needs: No Transportation Needs (10/06/2020)   PRAPARE - Administrator, Civil Service (Medical): No    Lack of Transportation (Non-Medical): No  Physical Activity: Inactive (10/06/2020)   Exercise Vital Sign    Days of Exercise per Week: 0 days    Minutes of Exercise per Session: 0 min  Stress: Stress Concern Present (10/06/2020)   [employer] of Occupational Health - Occupational Stress Questionnaire    Feeling of Stress : To some extent  Social Connections: Socially Isolated (10/06/2020)   Social Connection and Isolation Panel [NHANES]    Frequency of Communication with Friends and Family: Once a week    Frequency of Social Gatherings with Friends and Family: Once a week    Attends Religious Services: 1 to 4 times per year    Active Member of [employer] or Organizations: No    Attends Banker Meetings: Never    Marital Status: Divorced  Catering manager Violence: Not At Risk (10/06/2020)   Humiliation, Afraid, Rape, and Kick questionnaire    Fear of Current or Ex-Partner: No    Emotionally Abused: No    Physically Abused: No    Sexually Abused: No    Review of Systems   Gen: + fatigue. Denies any fever, chills, weight loss, lack of appetite.  CV: Denies chest pain, heart palpitations, peripheral edema, syncope.  Resp: Denies shortness of breath at rest or with exertion. Denies wheezing or cough.  GI: see HPI GU : + pelvic pain and vaginal spotting. Denies urinary burning, urinary frequency, urinary  hesitancy MS: Denies joint pain, muscle weakness, cramps, or limitation of movement.  Derm: Denies rash, itching, dry skin Psych: Denies depression, anxiety, memory loss, and confusion Heme: Denies bruising, bleeding, and enlarged lymph nodes.  Physical Exam   BP 114/76 (BP Location: Right Arm, Patient Position: Sitting, Cuff Size: Normal)   Pulse 80   Temp 98.1 F (36.7 C) (Oral)   Ht 5' 5.5" (1.664 m)   Wt 150 lb 6.4 oz (68.2 kg)   LMP 07/08/2013 Comment: perimenopausal  SpO2 95%   BMI 24.65 kg/m   General:   Alert  and oriented. Pleasant and cooperative. Well-nourished and well-developed.  Head:  Normocephalic and atraumatic. Eyes:  Without icterus, sclera clear and conjunctiva pink.  Ears:  Normal auditory acuity. Mouth:  No deformity or lesions, oral mucosa pink.  Lungs:  Clear to auscultation bilaterally. No wheezes, rales, or rhonchi. No distress.  Heart:  S1, S2 present without murmurs appreciated.  Abdomen:  +BS, soft, non-tender and non-distended. No HSM noted. No guarding or rebound. No masses appreciated.  Rectal:  Deferred  Msk:  Symmetrical without gross deformities. Normal posture. Extremities:  Without edema. Neurologic:  Alert and  oriented x4;  grossly normal neurologically. Skin:  Intact without significant lesions or rashes. Psych:  Alert and cooperative. Normal mood and affect.   Assessment   Stacey Macdonald is a 57 y.o. female with a history of anxiety, GERD, tachycardia, HLD presenting today for evaluation of positive Cologuard.  Positive Cologuard: Positive Cologuard 5/14.  She states she was told that with this Cologuard sample that blood could have been present but she has not seen any melena or BRBPR.  She denies any overt abdominal pain, early satiety, nausea, vomiting, dizziness, lightheadedness, syncope, shortness of breath, chest pain.  No recent blood work however she does report a B12 deficiency for which she is taking oral supplement for.   Due to have labs with PCP prior to her next visit.  Has never had a prior colonoscopy.  Does have some baseline constipation but with some occasional diarrhea secondary to anxiety/depression.   GERD: Does have some occasional breakthrough symptoms but overall controlled with omeprazole 20 mg once daily.  Offered increasing dose to 40 mg daily however patient would like to keep her current dose.  I did recommend that she could take over-the-counter famotidine as needed for breakthrough.  GERD diet reinforced.  PLAN   Continue omeprazole 20 mg once daily Famotidine 20 mg once daily as needed for breakthrough GERD diet Proceed with colonoscopy with propofol by Dr. Jena Gauss in near future: the risks, benefits, and alternatives have been discussed with the patient in detail. The patient states understanding and desires to proceed. ASA 2 Follow up 6 months   Brooke Bonito, MSN, FNP-BC, AGACNP-BC [facility]

## 2022-07-31 NOTE — Telephone Encounter (Signed)
[employer] PA:  Approved Authorization #[id number]  Tracking #[id number] Dates of service 08/24/2022 - 11/23/2022

## 2022-07-31 NOTE — H&P (View-Only) (Signed)
  GI Office Note    Referring Provider: Dixon, Phillip E, MD Primary Care Physician:  Dixon, Phillip E, MD  Primary Gastroenterologist: Robert M.Rourk, MD  Chief Complaint   Chief Complaint  Patient presents with   Colonoscopy    Positive cologuard, never had a colonoscopy.   History of Present Illness   Stacey Macdonald is a 57 y.o. female presenting today at the request of Dixon, Phillip E, MD for colonoscopy given positive Cologuard.  Positive Cologuard 06/27/2022.  Labs ordered 06/20/2022 including CBC, CMP, lipid panel, A1c, TSH, vitamin D, and B12/folate panel which have not been completed.  Today: No family history of colon cancer. Sister has had some benign polyps removed.   Sometimes constipated, has gone 3 times today but has been nervous. Leans more toward constipation than diarrhea usually. No straining.   No melena or brbpr. She reports + FOBT in cologaurd. Does have some lower abdominal pain and due to see GYN regarding possible hysterectomy and pelvic pain. Does wear a panty liner and has some mild vaginal spotting. No unintentional weight loss. AT baseline she does not eat a lot (eats too much of wrong thing or doesn't eat much).  Does report a B12 deficiency and does have fatigue (chronic). Does have some acid reflux depending on her mood and anything acidic does bother her. She used to burp a lot and relieves the bloating sensation. Takes omeprazole 20 mg once daily.   Denies any nausea, vomiting, or dysphagia.  Denies any early satiety or any upper abdominal pain.   Current Outpatient Medications  Medication Sig Dispense Refill   ALPRAZolam (XANAX) 1 MG tablet 1 mg. Up to five times a day     atenolol (TENORMIN) 25 MG tablet TAKE (1/2) TABLET BY MOUTH TWICE DAILY. (Patient taking differently: 12.5 mg daily.) 90 tablet 0   atorvastatin (LIPITOR) 20 MG tablet Take 20 mg by mouth daily.     Cholecalciferol (VITAMIN D-3) 25 MCG (1000 UT) CAPS Take 1 capsule by  mouth daily.     doxepin (SINEQUAN) 10 MG capsule Take 10 mg by mouth. Take up to 5 pills a day     ibuprofen (ADVIL) 600 MG tablet Take 1 tablet (600 mg total) by mouth every 8 (eight) hours as needed. 30 tablet 0   Multiple Vitamins-Minerals (CENTRUM SILVER 50+WOMEN PO) Take 1 tablet by mouth daily.     omeprazole (PRILOSEC) 20 MG capsule Take 20 mg by mouth daily.     vitamin B-12 (CYANOCOBALAMIN) 500 MCG tablet Take 500 mcg by mouth daily.     No current facility-administered medications for this visit.    Past Medical History:  Diagnosis Date   Adjustment disorder with depressed mood    Agoraphobia with panic attacks    GERD (gastroesophageal reflux disease)    Heart palpitations    Insomnia    Nicotine dependence    Pure hyperglyceridemia    Tachycardia     Past Surgical History:  Procedure Laterality Date   stage 2 precancer cells removal  1990's   TUBAL LIGATION     tubal surgeries   2000    Family History  Problem Relation Age of Onset   Heart attack Father    Hypertension Maternal Grandmother    Other Maternal Grandfather        bleeding ulcer    Allergies as of 07/31/2022 - Review Complete 07/31/2022  Allergen Reaction Noted   Antihistamines, chlorpheniramine-type  02/22/2018   Morphine and   codeine  02/22/2018    Social History   Socioeconomic History   Marital status: Divorced    Spouse name: Not on file   Number of children: Not on file   Years of education: Not on file   Highest education level: Not on file  Occupational History   Not on file  Tobacco Use   Smoking status: Former    Types: Cigarettes    Quit date: 02/23/2008    Years since quitting: 14.4   Smokeless tobacco: Never  Vaping Use   Vaping Use: Never used  Substance and Sexual Activity   Alcohol use: Not Currently   Drug use: Not Currently    Types: Marijuana   Sexual activity: Not Currently    Birth control/protection: Surgical, Post-menopausal    Comment: tubal  Other  Topics Concern   Not on file  Social History Narrative   Not on file   Social Determinants of Health   Financial Resource Strain: Low Risk  (10/06/2020)   Overall Financial Resource Strain (CARDIA)    Difficulty of Paying Living Expenses: Not hard at all  Food Insecurity: No Food Insecurity (10/06/2020)   Hunger Vital Sign    Worried About Running Out of Food in the Last Year: Never true    Ran Out of Food in the Last Year: Never true  Transportation Needs: No Transportation Needs (10/06/2020)   PRAPARE - Transportation    Lack of Transportation (Medical): No    Lack of Transportation (Non-Medical): No  Physical Activity: Inactive (10/06/2020)   Exercise Vital Sign    Days of Exercise per Week: 0 days    Minutes of Exercise per Session: 0 min  Stress: Stress Concern Present (10/06/2020)   [employer] - Occupational Stress Questionnaire    Feeling of Stress : To some extent  Social Connections: Socially Isolated (10/06/2020)   Social Connection and Isolation Panel [NHANES]    Frequency of Communication with Friends and Family: Once a week    Frequency of Social Gatherings with Friends and Family: Once a week    Attends Religious Services: 1 to 4 times per year    Active Member of Clubs or Organizations: No    Attends Club or Organization Meetings: Never    Marital Status: Divorced  Intimate Partner Violence: Not At Risk (10/06/2020)   Humiliation, Afraid, Rape, and Kick questionnaire    Fear of Current or Ex-Partner: No    Emotionally Abused: No    Physically Abused: No    Sexually Abused: No    Review of Systems   Gen: + fatigue. Denies any fever, chills, weight loss, lack of appetite.  CV: Denies chest pain, heart palpitations, peripheral edema, syncope.  Resp: Denies shortness of breath at rest or with exertion. Denies wheezing or cough.  GI: see HPI GU : + pelvic pain and vaginal spotting. Denies urinary burning, urinary frequency, urinary  hesitancy MS: Denies joint pain, muscle weakness, cramps, or limitation of movement.  Derm: Denies rash, itching, dry skin Psych: Denies depression, anxiety, memory loss, and confusion Heme: Denies bruising, bleeding, and enlarged lymph nodes.  Physical Exam   BP 114/76 (BP Location: Right Arm, Patient Position: Sitting, Cuff Size: Normal)   Pulse 80   Temp 98.1 F (36.7 C) (Oral)   Ht 5' 5.5" (1.664 m)   Wt 150 lb 6.4 oz (68.2 kg)   LMP 07/08/2013 Comment: perimenopausal  SpO2 95%   BMI 24.65 kg/m   General:   Alert   and oriented. Pleasant and cooperative. Well-nourished and well-developed.  Head:  Normocephalic and atraumatic. Eyes:  Without icterus, sclera clear and conjunctiva pink.  Ears:  Normal auditory acuity. Mouth:  No deformity or lesions, oral mucosa pink.  Lungs:  Clear to auscultation bilaterally. No wheezes, rales, or rhonchi. No distress.  Heart:  S1, S2 present without murmurs appreciated.  Abdomen:  +BS, soft, non-tender and non-distended. No HSM noted. No guarding or rebound. No masses appreciated.  Rectal:  Deferred  Msk:  Symmetrical without gross deformities. Normal posture. Extremities:  Without edema. Neurologic:  Alert and  oriented x4;  grossly normal neurologically. Skin:  Intact without significant lesions or rashes. Psych:  Alert and cooperative. Normal mood and affect.   Assessment   Kyndell H Gathright is a 57 y.o. female with a history of anxiety, GERD, tachycardia, HLD presenting today for evaluation of positive Cologuard.  Positive Cologuard: Positive Cologuard 5/14.  She states she was told that with this Cologuard sample that blood could have been present but she has not seen any melena or BRBPR.  She denies any overt abdominal pain, early satiety, nausea, vomiting, dizziness, lightheadedness, syncope, shortness of breath, chest pain.  No recent blood work however she does report a B12 deficiency for which she is taking oral supplement for.   Due to have labs with PCP prior to her next visit.  Has never had a prior colonoscopy.  Does have some baseline constipation but with some occasional diarrhea secondary to anxiety/depression.   GERD: Does have some occasional breakthrough symptoms but overall controlled with omeprazole 20 mg once daily.  Offered increasing dose to 40 mg daily however patient would like to keep her current dose.  I did recommend that she could take over-the-counter famotidine as needed for breakthrough.  GERD diet reinforced.  PLAN   Continue omeprazole 20 mg once daily Famotidine 20 mg once daily as needed for breakthrough GERD diet Proceed with colonoscopy with propofol by Dr. Rourk in near future: the risks, benefits, and alternatives have been discussed with the patient in detail. The patient states understanding and desires to proceed. ASA 2 Follow up 6 months   Shakir Petrosino, MSN, FNP-BC, AGACNP-BC [facility] 

## 2022-08-08 ENCOUNTER — Encounter: Payer: Self-pay | Admitting: Gastroenterology

## 2022-08-24 ENCOUNTER — Other Ambulatory Visit: Payer: Self-pay

## 2022-08-24 ENCOUNTER — Ambulatory Visit (HOSPITAL_COMMUNITY): Payer: Medicare HMO | Admitting: Anesthesiology

## 2022-08-24 ENCOUNTER — Encounter (HOSPITAL_COMMUNITY)
Admission: RE | Disposition: A | Discharge status: Home / Self Care | Payer: Self-pay | Source: Home / Self Care | Attending: Internal Medicine

## 2022-08-24 ENCOUNTER — Ambulatory Visit (HOSPITAL_COMMUNITY)
Admission: RE | Admit: 2022-08-24 | Discharge: 2022-08-24 | Disposition: A | Discharge status: Home / Self Care | Payer: Medicare HMO | Attending: Internal Medicine | Admitting: Internal Medicine

## 2022-08-24 ENCOUNTER — Ambulatory Visit (HOSPITAL_BASED_OUTPATIENT_CLINIC_OR_DEPARTMENT_OTHER): Payer: Medicare HMO | Admitting: Anesthesiology

## 2022-08-24 ENCOUNTER — Encounter (HOSPITAL_COMMUNITY): Payer: Self-pay | Admitting: Internal Medicine

## 2022-08-24 DIAGNOSIS — R197 Diarrhea, unspecified: Secondary | ICD-10-CM | POA: Insufficient documentation

## 2022-08-24 DIAGNOSIS — Z87891 Personal history of nicotine dependence: Secondary | ICD-10-CM | POA: Diagnosis not present

## 2022-08-24 DIAGNOSIS — K219 Gastro-esophageal reflux disease without esophagitis: Secondary | ICD-10-CM | POA: Insufficient documentation

## 2022-08-24 DIAGNOSIS — E538 Deficiency of other specified B group vitamins: Secondary | ICD-10-CM | POA: Diagnosis not present

## 2022-08-24 DIAGNOSIS — E039 Hypothyroidism, unspecified: Secondary | ICD-10-CM | POA: Diagnosis not present

## 2022-08-24 DIAGNOSIS — F32A Depression, unspecified: Secondary | ICD-10-CM | POA: Insufficient documentation

## 2022-08-24 DIAGNOSIS — Z79899 Other long term (current) drug therapy: Secondary | ICD-10-CM | POA: Diagnosis not present

## 2022-08-24 DIAGNOSIS — D120 Benign neoplasm of cecum: Secondary | ICD-10-CM | POA: Diagnosis not present

## 2022-08-24 DIAGNOSIS — D125 Benign neoplasm of sigmoid colon: Secondary | ICD-10-CM | POA: Insufficient documentation

## 2022-08-24 DIAGNOSIS — R195 Other fecal abnormalities: Secondary | ICD-10-CM | POA: Diagnosis not present

## 2022-08-24 DIAGNOSIS — K5900 Constipation, unspecified: Secondary | ICD-10-CM | POA: Insufficient documentation

## 2022-08-24 DIAGNOSIS — E785 Hyperlipidemia, unspecified: Secondary | ICD-10-CM | POA: Insufficient documentation

## 2022-08-24 DIAGNOSIS — F419 Anxiety disorder, unspecified: Secondary | ICD-10-CM | POA: Diagnosis not present

## 2022-08-24 DIAGNOSIS — R000 Tachycardia, unspecified: Secondary | ICD-10-CM | POA: Insufficient documentation

## 2022-08-24 DIAGNOSIS — Z1211 Encounter for screening for malignant neoplasm of colon: Secondary | ICD-10-CM | POA: Diagnosis not present

## 2022-08-24 DIAGNOSIS — D126 Benign neoplasm of colon, unspecified: Secondary | ICD-10-CM

## 2022-08-24 HISTORY — PX: COLONOSCOPY WITH PROPOFOL: SHX5780

## 2022-08-24 HISTORY — PX: POLYPECTOMY: SHX5525

## 2022-08-24 HISTORY — PX: POLYPECTOMY: SHX149

## 2022-08-24 SURGERY — COLONOSCOPY WITH PROPOFOL
Anesthesia: General

## 2022-08-24 MED ORDER — PHENYLEPHRINE HCL (PRESSORS) 10 MG/ML IV SOLN
INTRAVENOUS | Status: DC | PRN
  Administered 2022-08-24 (×5): 80 ug via INTRAVENOUS

## 2022-08-24 MED ORDER — PROPOFOL 10 MG/ML IV BOLUS
INTRAVENOUS | Status: DC | PRN
  Administered 2022-08-24: 130 mg via INTRAVENOUS

## 2022-08-24 MED ORDER — PROPOFOL 500 MG/50ML IV EMUL
INTRAVENOUS | Status: DC | PRN
  Administered 2022-08-24: 125 ug/kg/min via INTRAVENOUS

## 2022-08-24 MED ORDER — LACTATED RINGERS IV SOLN: INTRAVENOUS | Status: DC

## 2022-08-24 NOTE — Op Note (Addendum)
[facility] Patient Name: Stacey Macdonald Procedure Date: 08/24/2022 12:19 PM MRN: [id number] Date of Birth: 23-Jan-1966 Attending MD: Gennette Pac , MD, [id number] CSN: [id number] Age: 57 Admit Type: Outpatient Procedure:                Colonoscopy Indications:              Positive Cologuard test Providers:                Gennette Pac, MD, Nena Polio, RN, Zena Amos Referring MD:              Medicines:                Propofol per Anesthesia Complications:            No immediate complications. Estimated Blood Loss:     Estimated blood loss: none. Procedure:                After obtaining informed consent, the colonoscope                            was passed under direct vision. Throughout the                            procedure, the patient's blood pressure, pulse, and                            oxygen saturations were monitored continuously. The                            [id number]) scope was introduced through the                            anus and advanced to the the cecum, identified by                            appendiceal orifice and ileocecal valve. Scope In: 1:24:36 PM Scope Out: 1:38:20 PM Scope Withdrawal Time: 0 hours 9 minutes 55 seconds  Total Procedure Duration: 0 hours 13 minutes 44 seconds  Findings:      The perianal and digital rectal examinations were normal.      A 5 mm polyp was found in the ileocecal valve. The polyp was sessile.       The polyp was removed with a cold snare. Resection and retrieval were       complete. Estimated blood loss was minimal.      An 8 mm polyp was found in the sigmoid colon. The polyp was       pedunculated. The polyp was removed with a hot snare. Resection and       retrieval were complete. Estimated blood loss: none.      Views.The exam was otherwise without abnormality on direct and       retroflexion views. Impression:               Rectal vault well-seen.  Appeared normal on?face.  Rectal vault too small to retroflex.- One 5 mm                            polyp at the ileocecal valve, removed with a cold                            snare. Resected and retrieved.                           - One 8 mm polyp in the sigmoid colon, removed with                            a hot snare. Resected and retrieved.                           - The examination was otherwise normal on direct                            and retroflexion views. Moderate Sedation:      Moderate (conscious) sedation was personally administered by an       anesthesia professional. The following parameters were monitored: oxygen       saturation, heart rate, blood pressure, respiratory rate, EKG, adequacy       of pulmonary ventilation, and response to care. Recommendation:           - Patient has a contact number available for                            emergencies. The signs and symptoms of potential                            delayed complications were discussed with the                            patient. Return to normal activities tomorrow.                            Written discharge instructions were provided to the                            patient.                           - Advance diet as tolerated.                           - Continue present medications.                           - Repeat colonoscopy date to be determined after                            pending pathology results are reviewed for  surveillance.                           - Return to GI office (date not yet determined). Procedure Code(s):        --- Professional ---                           [phone], Colonoscopy, flexible; with removal of                            tumor(s), polyp(s), or other lesion(s) by snare                            technique Diagnosis Code(s):        --- Professional ---                           D12.0, Benign neoplasm of  cecum                           D12.5, Benign neoplasm of sigmoid colon                           R19.5, Other fecal abnormalities CPT copyright 2022 American Medical Association. All rights reserved. The codes documented in this report are preliminary and upon coder review may  be revised to meet current compliance requirements. Gerrit Friends. Redonna Wilbert, MD Gennette Pac, MD 08/24/2022 1:49:11 PM This report has been signed electronically. Number of Addenda: 0

## 2022-08-24 NOTE — Interval H&P Note (Signed)
History and Physical Interval Note:  08/24/2022 1:01 PM  Stacey Macdonald  has presented today for surgery, with the diagnosis of positive cologuard.  The various methods of treatment have been discussed with the patient and family. After consideration of risks, benefits and other options for treatment, the patient has consented to  Procedure(s) with comments: COLONOSCOPY WITH PROPOFOL (N/A) - 1:15 pm, asa 2 as a surgical intervention.  The patient's history has been reviewed, patient examined, no change in status, stable for surgery.  I have reviewed the patient's chart and labs.  Questions were answered to the patient's satisfaction.     Stacey Macdonald     No change.  Positive Cologuard no prior colonoscopy diagnostic colonoscopy today per plan. The risks, benefits, limitations, alternatives and imponderables have been reviewed with the patient. Questions have been answered. All parties are agreeable.

## 2022-08-24 NOTE — Transfer of Care (Signed)
Immediate Anesthesia Transfer of Care Note  Patient: Stacey Macdonald  Procedure(s) Performed: COLONOSCOPY WITH PROPOFOL POLYPECTOMY POLYPECTOMY INTESTINAL  Patient Location: Endoscopy Unit  Anesthesia Type:General  Level of Consciousness: awake and patient cooperative  Airway & Oxygen Therapy: Patient Spontanous Breathing  Post-op Assessment: Report given to RN and Post -op Vital signs reviewed and stable  Post vital signs: Reviewed and stable  Last Vitals:  Vitals Value Taken Time  BP 98/72 08/24/22 1347  Temp 36.5 C 08/24/22 1347  Pulse 73 08/24/22 1347  Resp 18 08/24/22 1347  SpO2 100 % 08/24/22 1347    Last Pain:  Vitals:   08/24/22 1347  TempSrc: Axillary  PainSc: 1       Patients Stated Pain Goal: 0 (08/24/22 1347)  Complications: No notable events documented.

## 2022-08-24 NOTE — Anesthesia Postprocedure Evaluation (Signed)
Anesthesia Post Note  Patient: Stacey Macdonald  Procedure(s) Performed: COLONOSCOPY WITH PROPOFOL POLYPECTOMY POLYPECTOMY INTESTINAL  Patient location during evaluation: Phase II Anesthesia Type: General Level of consciousness: awake and alert and oriented Pain management: pain level controlled Vital Signs Assessment: post-procedure vital signs reviewed and stable Respiratory status: spontaneous breathing, nonlabored ventilation and respiratory function stable Cardiovascular status: blood pressure returned to baseline and stable Postop Assessment: no apparent nausea or vomiting Anesthetic complications: no  No notable events documented.   Last Vitals:  Vitals:   08/24/22 1234 08/24/22 1347  BP: 122/73 98/72  Pulse: 83 73  Resp: 15 18  Temp: 36.7 C 36.5 C  SpO2: 99% 100%    Last Pain:  Vitals:   08/24/22 1347  TempSrc: Axillary  PainSc: 1                  Arley Garant C Anjeli Casad

## 2022-08-24 NOTE — Anesthesia Preprocedure Evaluation (Signed)
Anesthesia Evaluation  Patient identified by MRN, date of birth, ID band Patient awake    Reviewed: Allergy & Precautions, H&P , NPO status , Patient's Chart, lab work & pertinent test results, reviewed documented beta blocker date and time   Airway Mallampati: II  TM Distance: >3 FB Neck ROM: Full    Dental  (+) Dental Advisory Given, Poor Dentition, Edentulous Upper, Missing   Pulmonary neg pulmonary ROS, former smoker   Pulmonary exam normal breath sounds clear to auscultation       Cardiovascular (-) hypertensionPt. on home beta blockers Normal cardiovascular exam Rhythm:Regular Rate:Normal     Neuro/Psych  PSYCHIATRIC DISORDERS Anxiety     negative neurological ROS     GI/Hepatic Neg liver ROS,GERD  Medicated and Controlled,,  Endo/Other  Hypothyroidism    Renal/GU negative Renal ROS  negative genitourinary   Musculoskeletal negative musculoskeletal ROS (+)    Abdominal   Peds negative pediatric ROS (+)  Hematology negative hematology ROS (+)   Anesthesia Other Findings   Reproductive/Obstetrics negative OB ROS                             Anesthesia Physical Anesthesia Plan  ASA: 2  Anesthesia Plan: General   Post-op Pain Management: Minimal or no pain anticipated   Induction: Intravenous  PONV Risk Score and Plan: Propofol infusion  Airway Management Planned: Nasal Cannula and Natural Airway  Additional Equipment:   Intra-op Plan:   Post-operative Plan:   Informed Consent: I have reviewed the patients History and Physical, chart, labs and discussed the procedure including the risks, benefits and alternatives for the proposed anesthesia with the patient or authorized representative who has indicated his/her understanding and acceptance.     Dental advisory given  Plan Discussed with: CRNA and Surgeon  Anesthesia Plan Comments:        Anesthesia Quick  Evaluation

## 2022-08-24 NOTE — Discharge Instructions (Signed)
  Colonoscopy Discharge Instructions  Read the instructions outlined below and refer to this sheet in the next few weeks. These discharge instructions provide you with general information on caring for yourself after you leave the hospital. Your doctor may also give you specific instructions. While your treatment has been planned according to the most current medical practices available, unavoidable complications occasionally occur. If you have any problems or questions after discharge, call Dr. Jena Gauss at [phone]. ACTIVITY You may resume your regular activity, but move at a slower pace for the next 24 hours.  Take frequent rest periods for the next 24 hours.  Walking will help get rid of the air and reduce the bloated feeling in your belly (abdomen).  No driving for 24 hours (because of the medicine (anesthesia) used during the test).   Do not sign any important legal documents or operate any machinery for 24 hours (because of the anesthesia used during the test).  NUTRITION Drink plenty of fluids.  You may resume your normal diet as instructed by your doctor.  Begin with a light meal and progress to your normal diet. Heavy or fried foods are harder to digest and may make you feel sick to your stomach (nauseated).  Avoid alcoholic beverages for 24 hours or as instructed.  MEDICATIONS You may resume your normal medications unless your doctor tells you otherwise.  WHAT YOU CAN EXPECT TODAY Some feelings of bloating in the abdomen.  Passage of more gas than usual.  Spotting of blood in your stool or on the toilet paper.  IF YOU HAD POLYPS REMOVED DURING THE COLONOSCOPY: No aspirin products for 7 days or as instructed.  No alcohol for 7 days or as instructed.  Eat a soft diet for the next 24 hours.  FINDING OUT THE RESULTS OF YOUR TEST Not all test results are available during your visit. If your test results are not back during the visit, make an appointment with your caregiver to find out the  results. Do not assume everything is normal if you have not heard from your caregiver or the medical facility. It is important for you to follow up on all of your test results.  SEEK IMMEDIATE MEDICAL ATTENTION IF: You have more than a spotting of blood in your stool.  Your belly is swollen (abdominal distention).  You are nauseated or vomiting.  You have a temperature over 101.  You have abdominal pain or discomfort that is severe or gets worse throughout the day.       2 polyps removed from your colon today  Further recommendations to follow pending review of pathology report  At patient request, called Stacey Macdonald [phone]  -   notified patient  did well

## 2022-08-28 LAB — SURGICAL PATHOLOGY

## 2022-08-30 ENCOUNTER — Encounter (HOSPITAL_COMMUNITY): Payer: Self-pay | Admitting: Internal Medicine

## 2022-08-31 ENCOUNTER — Encounter: Payer: Self-pay | Admitting: Internal Medicine

## 2022-09-15 ENCOUNTER — Ambulatory Visit (INDEPENDENT_AMBULATORY_CARE_PROVIDER_SITE_OTHER): Payer: Medicare HMO | Admitting: Internal Medicine

## 2022-09-15 ENCOUNTER — Encounter: Payer: Self-pay | Admitting: Internal Medicine

## 2022-09-15 VITALS — BP 94/64 | HR 103 | Ht 65.0 in | Wt 152.0 lb

## 2022-09-15 DIAGNOSIS — R102 Pelvic and perineal pain: Secondary | ICD-10-CM | POA: Diagnosis not present

## 2022-09-15 DIAGNOSIS — E559 Vitamin D deficiency, unspecified: Secondary | ICD-10-CM

## 2022-09-15 DIAGNOSIS — D75839 Thrombocytosis, unspecified: Secondary | ICD-10-CM | POA: Insufficient documentation

## 2022-09-15 DIAGNOSIS — E782 Mixed hyperlipidemia: Secondary | ICD-10-CM | POA: Diagnosis not present

## 2022-09-15 DIAGNOSIS — F411 Generalized anxiety disorder: Secondary | ICD-10-CM

## 2022-09-15 DIAGNOSIS — R7303 Prediabetes: Secondary | ICD-10-CM | POA: Diagnosis not present

## 2022-09-15 DIAGNOSIS — F410 Panic disorder [episodic paroxysmal anxiety] without agoraphobia: Secondary | ICD-10-CM

## 2022-09-15 MED ORDER — ATORVASTATIN CALCIUM 40 MG PO TABS
40.0000 mg | ORAL_TABLET | Freq: Every day | ORAL | 3 refills | Status: AC
Start: 2022-09-15 — End: ?

## 2022-09-15 NOTE — Assessment & Plan Note (Signed)
A1c 5.8 on recent labs, consistent with prediabetes.  Lifestyle modifications aimed at improving her blood sugar were reviewed today

## 2022-09-15 NOTE — Assessment & Plan Note (Signed)
She is currently prescribed Xanax 1 mg 5 times daily as well as doxepin 10 mg with the discretion to take up to 5 tablets nightly.  Previously referred by me to psychiatry, but per documentation chose not to establish care as she did not desire to taper off of Xanax.  Xanax has previously been managed by her PCP, who has most recently written her prescription on 6/26. -I again reviewed with Stacey Macdonald why I strongly recommend that she establish care with psychiatry for chronic benzodiazepine management.  Her preference is to establish care at [facility].  I will request that her previously placed referral is reopened and sent to [facility].

## 2022-09-15 NOTE — Assessment & Plan Note (Signed)
Vitamin D level 25 on recent labs.  She remains on daily vitamin D supplementation.

## 2022-09-15 NOTE — Assessment & Plan Note (Signed)
Noted on recent labs. -Repeat CBC and iron studies ordered today

## 2022-09-15 NOTE — Assessment & Plan Note (Signed)
Reports that she has recently been evaluated by gynecology in the setting of chronic pelvic pain.  She is scheduled undergo pelvic ultrasound on 8/8.

## 2022-09-15 NOTE — Progress Notes (Signed)
Established Patient Office Visit  Subjective   Patient ID: Stacey Macdonald, female    DOB: 05/03/1965  Age: 57 y.o. MRN: [id number]  Chief Complaint  Patient presents with   Results    Follow up on labs.   Stacey Macdonald returns to care today for follow-up.  She was last evaluated by me as a new patient presenting to establish care on 5/7.  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  Baseline labs were ordered, and 13-month follow-up was arranged.  In the interim, she was referred to gastroenterology in the setting of a positive Cologuard.  She subsequently underwent colonoscopy on 7/11.  A repeat colonoscopy was recommended for 7 years.  There have otherwise been no acute interval events.  Stacey Macdonald reports feeling well today.  She would like to review recent lab results but is otherwise asymptomatic and has no acute concerns to discuss today.  Past Medical History:  Diagnosis Date   Adjustment disorder with depressed mood    Agoraphobia with panic attacks    GERD (gastroesophageal reflux disease)    Heart palpitations    Insomnia    Nicotine dependence    Osteoporosis    Pure hyperglyceridemia    Tachycardia    Uterine cancer (HCC)    pre cancerous cells. Had laser removal of cells.   Past Surgical History:  Procedure Laterality Date   COLONOSCOPY WITH PROPOFOL N/A 08/24/2022   Procedure: COLONOSCOPY WITH PROPOFOL;  Surgeon: Corbin Ade, MD;  Location: [facility];  Service: Endoscopy;  Laterality: N/A;  1:15 pm, asa 2   POLYPECTOMY  08/24/2022   Procedure: POLYPECTOMY;  Surgeon: Corbin Ade, MD;  Location: [facility];  Service: Endoscopy;;   POLYPECTOMY  08/24/2022   Procedure: POLYPECTOMY INTESTINAL;  Surgeon: Corbin Ade, MD;  Location: [facility];  Service: Endoscopy;;   stage 2 precancer cells removal  1990's   TUBAL LIGATION     tubal surgeries   2000   Social History    Tobacco Use   Smoking status: Former    Current packs/day: 0.00    Types: Cigarettes    Quit date: 02/23/2008    Years since quitting: 14.5   Smokeless tobacco: Never  Vaping Use   Vaping status: Never Used  Substance Use Topics   Alcohol use: Not Currently   Drug use: Not Currently    Types: Marijuana    Comment: rare once occurence   Family History  Problem Relation Age of Onset   Anxiety disorder Mother    Depression Mother    COPD Mother    Heart attack Father    Lung cancer Sister        stage 2. has received radiation   Gallbladder disease Sister        porcelain gallbladder   COPD Sister    Hypertension Maternal Grandmother    Other Maternal Grandfather        bleeding ulcer   Colon cancer Neg Hx    Allergies  Allergen Reactions   Antihistamines, Chlorpheniramine-Type     Allergic to antihistamines too   Morphine And Codeine Other (See Comments)    Unknown reaction.   Review of Systems  Constitutional:  Negative for chills and fever.  HENT:  Negative for sore throat.   Respiratory:  Negative for cough and shortness of breath.   Cardiovascular:  Negative for chest pain, palpitations  and leg swelling.  Gastrointestinal:  Negative for abdominal pain, blood in stool, constipation, diarrhea, nausea and vomiting.  Genitourinary:  Negative for dysuria and hematuria.  Musculoskeletal:  Negative for myalgias.  Skin:  Negative for itching and rash.  Neurological:  Negative for dizziness and headaches.  Psychiatric/Behavioral:  Negative for depression and suicidal ideas.      Objective:     BP 94/64   Pulse (!) 103   Ht 5\' 5"  (1.651 m)   Wt 152 lb (68.9 kg)   LMP 07/08/2013 Comment: perimenopausal  SpO2 93%   BMI 25.29 kg/m  BP Readings from Last 3 Encounters:  09/15/22 94/64  08/24/22 98/72  07/31/22 114/76   Physical Exam Vitals reviewed.  Constitutional:      General: She is not in acute distress.    Appearance: Normal appearance. She is not  toxic-appearing.  HENT:     Head: Normocephalic and atraumatic.     Right Ear: External ear normal.     Left Ear: External ear normal.     Nose: Nose normal. No congestion or rhinorrhea.     Mouth/Throat:     Mouth: Mucous membranes are moist.     Pharynx: Oropharynx is clear. No oropharyngeal exudate or posterior oropharyngeal erythema.  Eyes:     General: No scleral icterus.    Extraocular Movements: Extraocular movements intact.     Conjunctiva/sclera: Conjunctivae normal.     Pupils: Pupils are equal, round, and reactive to light.  Cardiovascular:     Rate and Rhythm: Normal rate and regular rhythm.     Pulses: Normal pulses.     Heart sounds: Normal heart sounds. No murmur heard.    No friction rub. No gallop.  Pulmonary:     Effort: Pulmonary effort is normal.     Breath sounds: Normal breath sounds. No wheezing, rhonchi or rales.  Abdominal:     General: Abdomen is flat. Bowel sounds are normal. There is no distension.     Palpations: Abdomen is soft.     Tenderness: There is no abdominal tenderness.  Musculoskeletal:        General: No swelling. Normal range of motion.     Cervical back: Normal range of motion.     Right lower leg: No edema.     Left lower leg: No edema.  Lymphadenopathy:     Cervical: No cervical adenopathy.  Skin:    General: Skin is warm and dry.     Capillary Refill: Capillary refill takes less than 2 seconds.     Coloration: Skin is not jaundiced.  Neurological:     General: No focal deficit present.     Mental Status: She is alert and oriented to person, place, and time.  Psychiatric:        Mood and Affect: Mood normal.        Behavior: Behavior normal.   Last CBC Lab Results  Component Value Date   WBC 10.4 09/08/2022   HGB 14.0 09/08/2022   HCT 42.1 09/08/2022   MCV 89 09/08/2022   MCH 29.6 09/08/2022   RDW 12.8 09/08/2022   PLT 512 (H) 09/08/2022   Last metabolic panel Lab Results  Component Value Date   GLUCOSE 98  09/08/2022   NA 144 09/08/2022   K 4.4 09/08/2022   CL 103 09/08/2022   CO2 25 09/08/2022   BUN 16 09/08/2022   CREATININE 0.75 09/08/2022   EGFR 92 09/08/2022   CALCIUM 9.9 09/08/2022  PROT 7.4 09/08/2022   ALBUMIN 4.6 09/08/2022   LABGLOB 2.8 09/08/2022   BILITOT 0.5 09/08/2022   ALKPHOS 122 (H) 09/08/2022   AST 40 09/08/2022   ALT 36 (H) 09/08/2022   Last lipids Lab Results  Component Value Date   CHOL 208 (H) 09/08/2022   HDL 41 09/08/2022   LDLCALC 126 (H) 09/08/2022   TRIG 230 (H) 09/08/2022   CHOLHDL 5.1 (H) 09/08/2022   Last hemoglobin A1c Lab Results  Component Value Date   HGBA1C 5.8 (H) 09/08/2022   Last thyroid functions Lab Results  Component Value Date   TSH 4.130 09/08/2022   Last vitamin D Lab Results  Component Value Date   VD25OH 25.9 (L) 09/08/2022   Last vitamin B12 and Folate Lab Results  Component Value Date   VITAMINB12 454 09/08/2022   FOLATE 7.9 09/08/2022   The 10-year ASCVD risk score (Arnett DK, et al., 2019) is: 2.1%    Assessment & Plan:   Problem List Items Addressed This Visit       Thrombocytosis    Noted on recent labs. -Repeat CBC and iron studies ordered today      Generalized anxiety disorder with panic attacks    She is currently prescribed Xanax 1 mg 5 times daily as well as doxepin 10 mg with the discretion to take up to 5 tablets nightly.  Previously referred by me to psychiatry, but per documentation chose not to establish care as she did not desire to taper off of Xanax.  Xanax has previously been managed by her PCP, who has most recently written her prescription on 6/26. -I again reviewed with Ms. Mayford Knife why I strongly recommend that she establish care with psychiatry for chronic benzodiazepine management.  Her preference is to establish care at [facility].  I will request that her previously placed referral is reopened and sent to [facility].      Hyperlipidemia - Primary    Previously  prescribed atorvastatin 20 mg daily.  Lipid panel updated last month.  Total cholesterol 208 and LDL 126.  Her 10-year ASCVD risk today is 2.1%.  She reports that she has recently seen her previous PCP (Dr. Sudie Bailey), who increased atorvastatin to 40 mg daily as a result. -Agree with increasing atorvastatin 40 mg daily      Vitamin D deficiency    Vitamin D level 25 on recent labs.  She remains on daily vitamin D supplementation.      Prediabetes    A1c 5.8 on recent labs, consistent with prediabetes.  Lifestyle modifications aimed at improving her blood sugar were reviewed today      Pelvic pain    Reports that she has recently been evaluated by gynecology in the setting of chronic pelvic pain.  She is scheduled undergo pelvic ultrasound on 8/8.       Return in about 6 months (around 03/18/2023).    Billie Lade, MD

## 2022-09-15 NOTE — Assessment & Plan Note (Signed)
Previously prescribed atorvastatin 20 mg daily.  Lipid panel updated last month.  Total cholesterol 208 and LDL 126.  Her 10-year ASCVD risk today is 2.1%.  She reports that she has recently seen her previous PCP (Dr. Sudie Bailey), who increased atorvastatin to 40 mg daily as a result. -Agree with increasing atorvastatin 40 mg daily

## 2022-09-15 NOTE — Patient Instructions (Addendum)
It was a pleasure to see you today.  Thank you for giving [place] the opportunity to be involved in your care.  Below is a brief recap of your visit and next steps.  We will plan to see you again in 6 months.  Summary Increase atorvastatin to 40 mg daily No additional medication changes Will switch psychiatry referral to [facility]  Follow up in 6 months    Schedule your Medicare Annual Wellness Visit at checkout.

## 2022-09-18 ENCOUNTER — Telehealth: Payer: Self-pay | Admitting: Internal Medicine

## 2022-09-18 NOTE — Telephone Encounter (Signed)
April called from [facility] received referral on patient and Has [employer] primary office needs to do prior authorization before they can see the patient. They will need the authorization code for patient to be seen. This is why never been scheduled.

## 2022-09-18 NOTE — Telephone Encounter (Signed)
Do you know anything about a referral authorization code?

## 2022-09-25 DIAGNOSIS — N83202 Unspecified ovarian cyst, left side: Secondary | ICD-10-CM | POA: Diagnosis not present

## 2022-09-25 DIAGNOSIS — D259 Leiomyoma of uterus, unspecified: Secondary | ICD-10-CM | POA: Diagnosis not present

## 2022-09-25 DIAGNOSIS — N858 Other specified noninflammatory disorders of uterus: Secondary | ICD-10-CM | POA: Diagnosis not present

## 2022-09-25 DIAGNOSIS — R102 Pelvic and perineal pain: Secondary | ICD-10-CM | POA: Diagnosis not present

## 2022-10-03 DIAGNOSIS — N950 Postmenopausal bleeding: Secondary | ICD-10-CM | POA: Diagnosis not present

## 2022-10-27 DIAGNOSIS — G4700 Insomnia, unspecified: Secondary | ICD-10-CM | POA: Diagnosis not present

## 2022-10-27 DIAGNOSIS — R21 Rash and other nonspecific skin eruption: Secondary | ICD-10-CM | POA: Diagnosis not present

## 2022-10-27 DIAGNOSIS — L298 Other pruritus: Secondary | ICD-10-CM | POA: Diagnosis not present

## 2022-10-27 DIAGNOSIS — I10 Essential (primary) hypertension: Secondary | ICD-10-CM | POA: Diagnosis not present

## 2022-10-27 DIAGNOSIS — E785 Hyperlipidemia, unspecified: Secondary | ICD-10-CM | POA: Diagnosis not present

## 2022-10-27 DIAGNOSIS — J449 Chronic obstructive pulmonary disease, unspecified: Secondary | ICD-10-CM | POA: Diagnosis not present

## 2022-10-27 DIAGNOSIS — K219 Gastro-esophageal reflux disease without esophagitis: Secondary | ICD-10-CM | POA: Diagnosis not present

## 2022-10-27 DIAGNOSIS — F419 Anxiety disorder, unspecified: Secondary | ICD-10-CM | POA: Diagnosis not present

## 2022-11-16 DIAGNOSIS — N950 Postmenopausal bleeding: Secondary | ICD-10-CM | POA: Diagnosis not present

## 2022-11-16 DIAGNOSIS — N882 Stricture and stenosis of cervix uteri: Secondary | ICD-10-CM | POA: Diagnosis not present

## 2022-11-20 DIAGNOSIS — M4802 Spinal stenosis, cervical region: Secondary | ICD-10-CM | POA: Diagnosis not present

## 2022-11-20 DIAGNOSIS — Z87891 Personal history of nicotine dependence: Secondary | ICD-10-CM | POA: Diagnosis not present

## 2022-11-20 DIAGNOSIS — N719 Inflammatory disease of uterus, unspecified: Secondary | ICD-10-CM | POA: Diagnosis not present

## 2022-11-20 DIAGNOSIS — Z79899 Other long term (current) drug therapy: Secondary | ICD-10-CM | POA: Diagnosis not present

## 2022-11-20 DIAGNOSIS — R9389 Abnormal findings on diagnostic imaging of other specified body structures: Secondary | ICD-10-CM | POA: Diagnosis not present

## 2022-11-20 DIAGNOSIS — N950 Postmenopausal bleeding: Secondary | ICD-10-CM | POA: Diagnosis not present

## 2022-11-20 DIAGNOSIS — E119 Type 2 diabetes mellitus without complications: Secondary | ICD-10-CM | POA: Diagnosis not present

## 2022-11-20 DIAGNOSIS — N882 Stricture and stenosis of cervix uteri: Secondary | ICD-10-CM | POA: Diagnosis not present

## 2022-12-07 DIAGNOSIS — F419 Anxiety disorder, unspecified: Secondary | ICD-10-CM | POA: Diagnosis not present

## 2022-12-07 DIAGNOSIS — I10 Essential (primary) hypertension: Secondary | ICD-10-CM | POA: Diagnosis not present

## 2022-12-19 DIAGNOSIS — D75839 Thrombocytosis, unspecified: Secondary | ICD-10-CM | POA: Diagnosis not present

## 2022-12-19 DIAGNOSIS — I10 Essential (primary) hypertension: Secondary | ICD-10-CM | POA: Diagnosis not present

## 2022-12-19 DIAGNOSIS — D72829 Elevated white blood cell count, unspecified: Secondary | ICD-10-CM | POA: Diagnosis not present

## 2022-12-19 DIAGNOSIS — R000 Tachycardia, unspecified: Secondary | ICD-10-CM | POA: Diagnosis not present

## 2022-12-19 DIAGNOSIS — G4700 Insomnia, unspecified: Secondary | ICD-10-CM | POA: Diagnosis not present

## 2022-12-19 DIAGNOSIS — Z87891 Personal history of nicotine dependence: Secondary | ICD-10-CM | POA: Diagnosis not present

## 2022-12-19 DIAGNOSIS — F419 Anxiety disorder, unspecified: Secondary | ICD-10-CM | POA: Diagnosis not present

## 2022-12-19 DIAGNOSIS — R748 Abnormal levels of other serum enzymes: Secondary | ICD-10-CM | POA: Diagnosis not present

## 2022-12-19 DIAGNOSIS — E785 Hyperlipidemia, unspecified: Secondary | ICD-10-CM | POA: Diagnosis not present

## 2022-12-22 DIAGNOSIS — D72829 Elevated white blood cell count, unspecified: Secondary | ICD-10-CM | POA: Diagnosis not present

## 2022-12-22 DIAGNOSIS — R748 Abnormal levels of other serum enzymes: Secondary | ICD-10-CM | POA: Diagnosis not present

## 2022-12-22 DIAGNOSIS — R102 Pelvic and perineal pain: Secondary | ICD-10-CM | POA: Diagnosis not present

## 2022-12-25 ENCOUNTER — Other Ambulatory Visit (HOSPITAL_COMMUNITY): Payer: Self-pay | Admitting: Nurse Practitioner

## 2022-12-25 DIAGNOSIS — Z87891 Personal history of nicotine dependence: Secondary | ICD-10-CM

## 2022-12-26 ENCOUNTER — Ambulatory Visit: Payer: Medicare HMO

## 2022-12-26 NOTE — Progress Notes (Unsigned)
Error patient has changed PCP to Dr.Zac Hall - L.Wilson,Lpn

## 2023-01-05 ENCOUNTER — Ambulatory Visit (HOSPITAL_COMMUNITY): Admission: RE | Admit: 2023-01-05 | Payer: Medicare HMO | Source: Ambulatory Visit

## 2023-01-29 ENCOUNTER — Ambulatory Visit: Payer: Medicare HMO | Admitting: Gastroenterology

## 2023-02-02 ENCOUNTER — Ambulatory Visit (HOSPITAL_COMMUNITY)
Admission: RE | Admit: 2023-02-02 | Discharge: 2023-02-02 | Disposition: A | Discharge status: Home / Self Care | Payer: Medicare HMO | Source: Ambulatory Visit | Attending: Nurse Practitioner | Admitting: Nurse Practitioner

## 2023-02-02 DIAGNOSIS — Z87891 Personal history of nicotine dependence: Secondary | ICD-10-CM | POA: Diagnosis not present

## 2023-02-06 ENCOUNTER — Ambulatory Visit (HOSPITAL_COMMUNITY): Payer: Medicare HMO

## 2023-02-20 ENCOUNTER — Ambulatory Visit: Payer: Medicare HMO | Admitting: Gastroenterology

## 2023-02-28 DIAGNOSIS — G4700 Insomnia, unspecified: Secondary | ICD-10-CM | POA: Diagnosis not present

## 2023-02-28 DIAGNOSIS — K219 Gastro-esophageal reflux disease without esophagitis: Secondary | ICD-10-CM | POA: Diagnosis not present

## 2023-02-28 DIAGNOSIS — F419 Anxiety disorder, unspecified: Secondary | ICD-10-CM | POA: Diagnosis not present

## 2023-02-28 DIAGNOSIS — Z79899 Other long term (current) drug therapy: Secondary | ICD-10-CM | POA: Diagnosis not present

## 2023-02-28 DIAGNOSIS — F5105 Insomnia due to other mental disorder: Secondary | ICD-10-CM | POA: Diagnosis not present

## 2023-02-28 DIAGNOSIS — I10 Essential (primary) hypertension: Secondary | ICD-10-CM | POA: Diagnosis not present

## 2023-02-28 DIAGNOSIS — J439 Emphysema, unspecified: Secondary | ICD-10-CM | POA: Diagnosis not present

## 2023-06-15 DIAGNOSIS — E785 Hyperlipidemia, unspecified: Secondary | ICD-10-CM | POA: Diagnosis not present

## 2023-06-15 DIAGNOSIS — I10 Essential (primary) hypertension: Secondary | ICD-10-CM | POA: Diagnosis not present

## 2023-06-20 ENCOUNTER — Other Ambulatory Visit (HOSPITAL_COMMUNITY): Payer: Self-pay | Admitting: Internal Medicine

## 2023-06-20 ENCOUNTER — Other Ambulatory Visit (HOSPITAL_COMMUNITY): Payer: Self-pay | Admitting: Family Medicine

## 2023-06-20 DIAGNOSIS — G4709 Other insomnia: Secondary | ICD-10-CM | POA: Diagnosis not present

## 2023-06-20 DIAGNOSIS — Z13820 Encounter for screening for osteoporosis: Secondary | ICD-10-CM | POA: Diagnosis not present

## 2023-06-20 DIAGNOSIS — I10 Essential (primary) hypertension: Secondary | ICD-10-CM | POA: Diagnosis not present

## 2023-06-20 DIAGNOSIS — R000 Tachycardia, unspecified: Secondary | ICD-10-CM | POA: Diagnosis not present

## 2023-06-20 DIAGNOSIS — J439 Emphysema, unspecified: Secondary | ICD-10-CM | POA: Diagnosis not present

## 2023-06-20 DIAGNOSIS — R7301 Impaired fasting glucose: Secondary | ICD-10-CM | POA: Diagnosis not present

## 2023-06-20 DIAGNOSIS — Z7689 Persons encountering health services in other specified circumstances: Secondary | ICD-10-CM | POA: Diagnosis not present

## 2023-06-20 DIAGNOSIS — E559 Vitamin D deficiency, unspecified: Secondary | ICD-10-CM | POA: Diagnosis not present

## 2023-06-20 DIAGNOSIS — E782 Mixed hyperlipidemia: Secondary | ICD-10-CM | POA: Diagnosis not present

## 2023-06-20 DIAGNOSIS — G4700 Insomnia, unspecified: Secondary | ICD-10-CM | POA: Diagnosis not present

## 2023-06-20 DIAGNOSIS — D75839 Thrombocytosis, unspecified: Secondary | ICD-10-CM | POA: Diagnosis not present

## 2023-06-20 DIAGNOSIS — Z1231 Encounter for screening mammogram for malignant neoplasm of breast: Secondary | ICD-10-CM

## 2023-06-20 DIAGNOSIS — D72829 Elevated white blood cell count, unspecified: Secondary | ICD-10-CM | POA: Diagnosis not present

## 2023-06-20 DIAGNOSIS — F419 Anxiety disorder, unspecified: Secondary | ICD-10-CM | POA: Diagnosis not present

## 2023-06-20 DIAGNOSIS — R748 Abnormal levels of other serum enzymes: Secondary | ICD-10-CM | POA: Diagnosis not present

## 2023-06-29 ENCOUNTER — Other Ambulatory Visit: Payer: Self-pay

## 2023-06-29 ENCOUNTER — Inpatient Hospital Stay: Attending: Oncology | Admitting: Oncology

## 2023-06-29 ENCOUNTER — Inpatient Hospital Stay

## 2023-06-29 VITALS — BP 110/57 | HR 85 | Temp 98.0°F | Resp 18 | Wt 154.0 lb

## 2023-06-29 DIAGNOSIS — D72820 Lymphocytosis (symptomatic): Secondary | ICD-10-CM | POA: Insufficient documentation

## 2023-06-29 DIAGNOSIS — K5900 Constipation, unspecified: Secondary | ICD-10-CM | POA: Diagnosis not present

## 2023-06-29 DIAGNOSIS — D75839 Thrombocytosis, unspecified: Secondary | ICD-10-CM | POA: Diagnosis not present

## 2023-06-29 DIAGNOSIS — I700 Atherosclerosis of aorta: Secondary | ICD-10-CM | POA: Insufficient documentation

## 2023-06-29 DIAGNOSIS — R232 Flushing: Secondary | ICD-10-CM | POA: Diagnosis not present

## 2023-06-29 DIAGNOSIS — R7982 Elevated C-reactive protein (CRP): Secondary | ICD-10-CM | POA: Insufficient documentation

## 2023-06-29 DIAGNOSIS — J439 Emphysema, unspecified: Secondary | ICD-10-CM | POA: Insufficient documentation

## 2023-06-29 DIAGNOSIS — M549 Dorsalgia, unspecified: Secondary | ICD-10-CM | POA: Insufficient documentation

## 2023-06-29 DIAGNOSIS — Z885 Allergy status to narcotic agent status: Secondary | ICD-10-CM | POA: Insufficient documentation

## 2023-06-29 DIAGNOSIS — Z79899 Other long term (current) drug therapy: Secondary | ICD-10-CM | POA: Insufficient documentation

## 2023-06-29 DIAGNOSIS — Z87891 Personal history of nicotine dependence: Secondary | ICD-10-CM | POA: Diagnosis not present

## 2023-06-29 LAB — CBC WITH DIFFERENTIAL/PLATELET
Abs Immature Granulocytes: 0.02 10*3/uL (ref 0.00–0.07)
Basophils Absolute: 0.1 10*3/uL (ref 0.0–0.1)
Basophils Relative: 1 %
Eosinophils Absolute: 0.3 10*3/uL (ref 0.0–0.5)
Eosinophils Relative: 3 %
HCT: 43.1 % (ref 36.0–46.0)
Hemoglobin: 14.6 g/dL (ref 12.0–15.0)
Immature Granulocytes: 0 %
Lymphocytes Relative: 41 %
Lymphs Abs: 4.1 10*3/uL — ABNORMAL HIGH (ref 0.7–4.0)
MCH: 30.4 pg (ref 26.0–34.0)
MCHC: 33.9 g/dL (ref 30.0–36.0)
MCV: 89.6 fL (ref 80.0–100.0)
Monocytes Absolute: 0.5 10*3/uL (ref 0.1–1.0)
Monocytes Relative: 5 %
Neutro Abs: 4.9 10*3/uL (ref 1.7–7.7)
Neutrophils Relative %: 50 %
Platelets: 481 10*3/uL — ABNORMAL HIGH (ref 150–400)
RBC: 4.81 MIL/uL (ref 3.87–5.11)
RDW: 13.3 % (ref 11.5–15.5)
WBC: 9.9 10*3/uL (ref 4.0–10.5)
nRBC: 0 % (ref 0.0–0.2)

## 2023-06-29 LAB — URIC ACID: Uric Acid, Serum: 3.2 mg/dL (ref 2.5–7.1)

## 2023-06-29 LAB — C-REACTIVE PROTEIN: CRP: 1 mg/dL — ABNORMAL HIGH (ref <1.0)

## 2023-06-29 LAB — SEDIMENTATION RATE: Sed Rate: 26 mm/h — ABNORMAL HIGH (ref 0–22)

## 2023-06-29 LAB — LACTATE DEHYDROGENASE: LDH: 132 U/L (ref 98–192)

## 2023-06-29 NOTE — Progress Notes (Signed)
 [facility] at [facility]  HEMATOLOGY NEW VISIT  Stacey Bickers, MD  REASON FOR REFERRAL: Lymphocytosis and thrombocytosis   HISTORY OF PRESENT ILLNESS: Stacey Macdonald 58 y.o. female referred for elevated lymphocytes and thrombocytosis. The patient is accompanied by her sister today.  She has experienced elevated platelet counts since at least July 2024, with no evidence of iron deficiency. Additionally, she has elevated lymphocyte counts. No fever is present, but she experiences hot flashes and cold sweats, which she attributes to perimenopause. She sometimes feels cold but denies chills and does not experience nocturnal dyspnea. No night sweats. She experiences bloating and abdominal discomfort, particularly in the ovarian region, despite a normal colonoscopy and biopsy. The pain is persistent and impacts her daily activities. She also reports constipation.  She has a history of emphysema and quit smoking 13 years ago. She experiences shortness of breath and fatigue, which affect her ability to perform daily tasks such as cleaning. She notes decreased energy levels and back pain, requiring frequent rest while preparing meals.  She reports weight gain since quitting smoking, previously weighing 117 pounds, which she attributes to increased appetite and a fondness for chocolate.  I have reviewed the past medical history, past surgical history, social history and family history with the patient   ALLERGIES:  is allergic to antihistamines, chlorpheniramine-type and morphine and codeine.  MEDICATIONS:  Current Outpatient Medications  Medication Sig Dispense Refill   acetaminophen  (TYLENOL ) 500 MG tablet Take 500-1,000 mg by mouth every 6 (six) hours as needed (pain.).     albuterol (VENTOLIN HFA) 108 (90 Base) MCG/ACT inhaler Inhale 2 puffs into the lungs every 4 hours.     ALPRAZolam (XANAX) 1 MG tablet Take 1 mg by mouth See admin instructions. Take 1 tablet  (1 mg) by mouth at 0830, take 1 tablet (1 mg) by mouth at 1400, take 1 tablet (1 mg) by mouth at 1700, take 1 tablet (1 mg) by mouth at 2130, take 0.5 tablet (0.5 mg) by mouth at 0100 & may take an additional 0.5 tablet (0.5 mg) by mouth at 0200.     atenolol  (TENORMIN ) 25 MG tablet TAKE (1/2) TABLET BY MOUTH TWICE DAILY. 90 tablet 0   atorvastatin  (LIPITOR) 40 MG tablet Take 1 tablet (40 mg total) by mouth daily. 90 tablet 3   Biotin 1000 MCG CHEW Chew by mouth.     Cholecalciferol (VITAMIN D -3) 25 MCG (1000 UT) CAPS Take 1,000 Units by mouth daily with lunch.     Cyanocobalamin (VITAMIN B-12 PO) Take 1 tablet by mouth daily with lunch.     doxepin (SINEQUAN) 10 MG capsule Take 10-20 mg by mouth See admin instructions. Take 2 capsules (20 mg) by mouth at 5pm & take 2 capsules (20 mg) by mouth at 10p & take 1 capsule (10 mg) by mouth at midnight, if needed.     Hydrocortisone (CORTIZONE-10 EX) Apply 1 Application topically 3 (three) times daily as needed (rash/skin irritation/itching.).     Multiple Vitamin (MULTIVITAMIN WITH MINERALS) TABS tablet Take 1 tablet by mouth daily with lunch.     omeprazole (PRILOSEC) 20 MG capsule Take 20 mg by mouth every evening.     No current facility-administered medications for this visit.     REVIEW OF SYSTEMS:   Constitutional: Denies fevers, chills or night sweats Eyes: Denies blurriness of vision Ears, nose, mouth, throat, and face: Denies mucositis or sore throat Respiratory: Denies cough, dyspnea or wheezes Cardiovascular: Denies  palpitation, chest discomfort or lower extremity swelling Gastrointestinal:  Denies nausea, heartburn or change in bowel habits Skin: Denies abnormal skin rashes Lymphatics: Denies new lymphadenopathy or easy bruising Neurological:Denies numbness, tingling or new weaknesses Behavioral/Psych: Mood is stable, no new changes  All other systems were reviewed with the patient and are negative.  PHYSICAL  EXAMINATION:   Vitals:   06/29/23 1121  BP: (!) 110/57  Pulse: 85  Resp: 18  Temp: 98 F (36.7 C)  SpO2: 95%    GENERAL:alert, no distress and comfortable LYMPH:  no palpable lymphadenopathy in the cervical, axillary or inguinal LUNGS: clear to auscultation and percussion with normal breathing effort HEART: regular rate & rhythm and no murmurs and no lower extremity edema ABDOMEN:abdomen soft, non-tender and normal bowel sounds Musculoskeletal:no cyanosis of digits and no clubbing  NEURO: alert & oriented x 3 with fluent speech  LABORATORY DATA:  I have reviewed the data as listed in labs from primary care office on 06/21/2023  Rheumatoid factor: Less than 10, negative Iron: 30, ferritin: 106, TSAT: 22 iron saturation: 241 CMP: Sodium: 145, potassium: 4.4, creatinine: 0.82, GFR: 62, alkaline phosphatase: 144, AST: 48, ALT: 28 CBC: WBC: 10.1, hemoglobin: 14.9, hematocrit: 45.3, MCV: 91, platelets: 468, ANC: 4000, ALC: 5300  Lab Results  Component Value Date   WBC 10.4 09/08/2022   NEUTROABS 5.7 09/08/2022   HGB 14.0 09/08/2022   HCT 42.1 09/08/2022   MCV 89 09/08/2022   PLT 512 (H) 09/08/2022      Chemistry      Component Value Date/Time   NA 144 09/08/2022 1422   K 4.4 09/08/2022 1422   CL 103 09/08/2022 1422   CO2 25 09/08/2022 1422   BUN 16 09/08/2022 1422   CREATININE 0.75 09/08/2022 1422      Component Value Date/Time   CALCIUM  9.9 09/08/2022 1422   ALKPHOS 122 (H) 09/08/2022 1422   AST 40 09/08/2022 1422   ALT 36 (H) 09/08/2022 1422   BILITOT 0.5 09/08/2022 1422       RADIOGRAPHIC STUDIES: I have personally reviewed the radiological images as listed and agreed with the findings in the report.  CT CHEST LUNG CA SCREEN LOW DOSE W/O CM CLINICAL DATA:  30 pack-year smoking history/quit 13 years ago  EXAM: CT CHEST WITHOUT CONTRAST LOW-DOSE FOR LUNG CANCER SCREENING  TECHNIQUE: Multidetector CT imaging of the chest was performed following  the standard protocol without IV contrast.  RADIATION DOSE REDUCTION: This exam was performed according to the departmental dose-optimization program which includes automated exposure control, adjustment of the mA and/or kV according to patient size and/or use of iterative reconstruction technique.  COMPARISON:  None Available.  FINDINGS: Cardiovascular: Aortic atherosclerosis. Normal heart size, without pericardial effusion.  Mediastinum/Nodes: No mediastinal or hilar adenopathy, given limitations of unenhanced CT. Subtle esophageal fluid level on 31/2.  Lungs/Pleura: No pleural fluid. Right lower lobe scarring laterally. Mild centrilobular emphysema.  Right middle lobe scarring and volume loss as well.  No suspicious pulmonary nodule or mass.  Upper Abdomen: Normal imaged portions of the liver, stomach, pancreas, adrenal glands, kidneys. Old granulomatous disease in the spleen.  Musculoskeletal: No acute osseous abnormality.  IMPRESSION: 1. Lung-RADS 1, negative. Continue annual screening with low-dose chest CT without contrast in 12 months. 2. Esophageal air fluid level suggests dysmotility or gastroesophageal reflux. 3. Aortic Atherosclerosis (ICD10-I70.0) and Emphysema (ICD10-J43.9).  Electronically Signed   By: Lore Rode M.D.   On: 02/19/2023 11:26   ASSESSMENT & PLAN:  Patient  is a 58 y.o. female referred for elevated lymphocytes and thrombocytosis  Lymphocytosis Patient has a lymphocytes with normal WBC.  Likely inflammatory. No B symptoms.  - Will obtain flow cytometry to rule out CLL. - Will also obtain LDH and uric acid  Return to clinic in 2 weeks to discuss results    Thrombocytosis Patient has elevated platelets since at least July 2024.  Currently stable. No history of blood clots Iron labs within normal limits No history of splenectomy  - Will send for JAK2 testing today - Will obtain inflammatory markers including ESR and  CRP  Return to clinic in 2 weeks to discuss results   Orders Placed This Encounter  Procedures   CBC with Differential/Platelet    Standing Status:   Future    Number of Occurrences:   1    Expected Date:   06/29/2023    Expiration Date:   06/28/2024   Lactate dehydrogenase    Standing Status:   Future    Number of Occurrences:   1    Expected Date:   06/29/2023    Expiration Date:   06/28/2024   Sedimentation rate    Standing Status:   Future    Number of Occurrences:   1    Expected Date:   06/29/2023    Expiration Date:   06/28/2024   C-reactive protein    Standing Status:   Future    Number of Occurrences:   1    Expected Date:   06/29/2023    Expiration Date:   06/28/2024   Flow Cytometry, Peripheral Blood (Oncology)    Standing Status:   Future    Number of Occurrences:   1    Expected Date:   06/29/2023    Expiration Date:   06/28/2024   JAK2 V617 reflex CALR/MPL/E12-15    Standing Status:   Future    Number of Occurrences:   1    Expected Date:   06/29/2023    Expiration Date:   06/28/2024   Uric acid    Standing Status:   Future    Number of Occurrences:   1    Expected Date:   06/29/2023    Expiration Date:   06/28/2024    The total time spent in the appointment was 40 minutes encounter with patients including review of chart and various tests results, discussions about plan of care and coordination of care plan   All questions were answered. The patient knows to call the clinic with any problems, questions or concerns. No barriers to learning was detected.   Eduardo Grade, MD 5/16/202512:00 PM

## 2023-06-29 NOTE — Patient Instructions (Signed)
 VISIT SUMMARY:  Today, you were seen for an evaluation of your elevated platelet and lymphocyte counts. We discussed your symptoms, including hot flashes, night sweats, abdominal discomfort, constipation, fatigue, and shortness of breath. We also reviewed your history of prediabetes, emphysema, and recent weight gain.  YOUR PLAN:  -ELEVATED PLATELETS: Your platelet count has been elevated since July 2024. While common causes have been ruled out and the levels are not high enough to suggest cancer, we will test for a specific mutation that can cause high platelets.  -ELEVATED LYMPHOCYTES: Your lymphocyte count is slightly elevated, which could indicate inflammation or a type of blood cancer called chronic lymphocytic leukemia (CLL). We will perform a flow cytometry test to further evaluate your lymphocytes.   INSTRUCTIONS:  Please schedule a follow-up appointment in two weeks to discuss your lab results and further management.

## 2023-06-29 NOTE — Assessment & Plan Note (Signed)
 Patient has a lymphocytes with normal WBC.  Likely inflammatory. No B symptoms.  - Will obtain flow cytometry to rule out CLL. - Will also obtain LDH and uric acid  Return to clinic in 2 weeks to discuss results

## 2023-06-29 NOTE — Assessment & Plan Note (Addendum)
 Patient has elevated platelets since at least July 2024.  Currently stable. No history of blood clots Iron labs within normal limits No history of splenectomy  - Will send for JAK2 testing today - Will obtain inflammatory markers including ESR and CRP  Return to clinic in 2 weeks to discuss results

## 2023-07-02 LAB — SURGICAL PATHOLOGY

## 2023-07-03 LAB — FLOW CYTOMETRY

## 2023-07-05 LAB — JAK2 V617F RFX CALR/MPL/E12-15

## 2023-07-05 LAB — CALR +MPL + E12-E15  (REFLEX)

## 2023-07-12 ENCOUNTER — Inpatient Hospital Stay (HOSPITAL_BASED_OUTPATIENT_CLINIC_OR_DEPARTMENT_OTHER): Admitting: Oncology

## 2023-07-12 VITALS — BP 100/61 | HR 100 | Temp 97.4°F | Resp 17 | Ht 65.0 in | Wt 152.0 lb

## 2023-07-12 DIAGNOSIS — D75839 Thrombocytosis, unspecified: Secondary | ICD-10-CM

## 2023-07-12 DIAGNOSIS — R7982 Elevated C-reactive protein (CRP): Secondary | ICD-10-CM | POA: Diagnosis not present

## 2023-07-12 DIAGNOSIS — M549 Dorsalgia, unspecified: Secondary | ICD-10-CM | POA: Diagnosis not present

## 2023-07-12 DIAGNOSIS — Z79899 Other long term (current) drug therapy: Secondary | ICD-10-CM | POA: Diagnosis not present

## 2023-07-12 DIAGNOSIS — J439 Emphysema, unspecified: Secondary | ICD-10-CM | POA: Diagnosis not present

## 2023-07-12 DIAGNOSIS — D72820 Lymphocytosis (symptomatic): Secondary | ICD-10-CM | POA: Diagnosis not present

## 2023-07-12 DIAGNOSIS — I700 Atherosclerosis of aorta: Secondary | ICD-10-CM | POA: Diagnosis not present

## 2023-07-12 DIAGNOSIS — R232 Flushing: Secondary | ICD-10-CM | POA: Diagnosis not present

## 2023-07-12 DIAGNOSIS — K5900 Constipation, unspecified: Secondary | ICD-10-CM | POA: Diagnosis not present

## 2023-07-12 NOTE — Assessment & Plan Note (Addendum)
 Patient has elevated platelets since at least July 2024.  Currently stable.  Likely secondary to inflammation No history of blood clots Iron labs within normal limits No history of splenectomy JAK2/CALR/MPN/E 12-15: Negative ESR, CRP: Elevated  - Platelets downtrending - Continue to monitor counts - No indication for prophylactic anticoagulation at these levels or aspirin or hydroxyurea.  There is no increased risk of thrombosis at this level. - If the left platelets remain high, can workup for autoimmune diseases for inflammation  Return to clinic in 3 months with labs.  If platelets are stable at that time, can discharge from the clinic to be followed with primary care

## 2023-07-12 NOTE — Patient Instructions (Signed)
 VISIT SUMMARY:  Today, we reviewed your recent blood work and discussed your ongoing symptoms, including elevated platelet count, inflammation markers, headaches, postmenopausal bleeding, and hip and back pain. We also reviewed your recent gynecological evaluation and your current medications.  YOUR PLAN:  -ELEVATED PLATELET COUNT: Your platelet count is slightly elevated at 481, which is likely due to inflammation. Since the tests for leukemia and JAK2 mutation were negative, there is no risk of blood disorders. We will monitor your platelet count and inflammatory markers and repeat the labs in three months.   INSTRUCTIONS: Repeat labs for platelet count and inflammatory markers in three months.

## 2023-07-12 NOTE — Progress Notes (Signed)
 [facility] at [facility]  HEMATOLOGY FOLLOW-UP VISIT  Omie Bickers, MD  REASON FOR FOLLOW-UP: Thrombocytosis  ASSESSMENT & PLAN:  Patient is a 58 y.o. female following for thrombocytosis  Lymphocytosis Patient has a lymphocytes with normal WBC.  Likely inflammatory. No B symptoms. ESR, CRP: Elevated Flow cytometry: Negative LDH, uric acid: Normal  -Continue to monitor counts   Thrombocytosis Patient has elevated platelets since at least July 2024.  Currently stable.  Likely secondary to inflammation No history of blood clots Iron labs within normal limits No history of splenectomy JAK2/CALR/MPN/E 12-15: Negative ESR, CRP: Elevated  - Platelets downtrending - Continue to monitor counts - No indication for prophylactic anticoagulation at these levels or aspirin or hydroxyurea.  There is no increased risk of thrombosis at this level. - If the left platelets remain high, can workup for autoimmune diseases for inflammation  Return to clinic in 3 months with labs.  If platelets are stable at that time, can discharge from the clinic to be followed with primary care   Orders Placed This Encounter  Procedures   Ferritin    Standing Status:   Future    Expected Date:   10/08/2023    Expiration Date:   07/11/2024   CBC with Differential/Platelet    Standing Status:   Future    Expected Date:   10/08/2023    Expiration Date:   07/11/2024   Comprehensive metabolic panel with GFR    Standing Status:   Future    Expected Date:   10/08/2023    Expiration Date:   07/11/2024   Iron and TIBC    Standing Status:   Future    Expected Date:   10/08/2023    Expiration Date:   07/11/2024    The total time spent in the appointment was 20 minutes encounter with patients including review of chart and various tests results, discussions about plan of care and coordination of care plan   All questions were answered. The patient knows to call the clinic with any  problems, questions or concerns. No barriers to learning was detected.  Eduardo Grade, MD 5/29/20253:49 PM   SUMMARY OF HEMATOLOGIC HISTORY: -Lymphocytosis: Likely secondary to inflammation No B symptoms. ESR, CRP: Elevated Flow cytometry: Negative LDH, uric acid: Normal  - Thrombocytosis: Likely secondary to inflammation Iron labs within normal limits No history of splenectomy JAK2/CALR/MPN/E 12-15: Negative ESR, CRP: Elevated  INTERVAL HISTORY: Stacey Macdonald 58 y.o. female following for thrombocytosis. Patient was accompanied by her sister today.Recent blood work showed a normal white blood cell count and a decrease in platelet count, although it remains slightly elevated at 481. Testing for leukemia/flow cytometry and JAK2 mutation was negative. CRP and ESR levels were elevated, indicating inflammation, but the source of inflammation is unclear.  Patient reports some occasional vaginal spotting for which she is being worked up by her GYN.  No other complaints today and has been doing well.  Denies joint pains, rash.    I have reviewed the past medical history, past surgical history, social history and family history with the patient   ALLERGIES:  is allergic to antihistamines, chlorpheniramine-type and morphine and codeine.  MEDICATIONS:  Current Outpatient Medications  Medication Sig Dispense Refill   albuterol (VENTOLIN HFA) 108 (90 Base) MCG/ACT inhaler Inhale 2 puffs into the lungs every 4 hours.     ALPRAZolam (XANAX) 1 MG tablet Take 1 mg by mouth See admin instructions. Take 1 tablet (1 mg)  by mouth at 0830, take 1 tablet (1 mg) by mouth at 1400, take 1 tablet (1 mg) by mouth at 1700, take 1 tablet (1 mg) by mouth at 2130, take 0.5 tablet (0.5 mg) by mouth at 0100 & may take an additional 0.5 tablet (0.5 mg) by mouth at 0200.     atenolol  (TENORMIN ) 25 MG tablet TAKE (1/2) TABLET BY MOUTH TWICE DAILY. 90 tablet 0   atorvastatin  (LIPITOR) 40 MG tablet Take 1  tablet (40 mg total) by mouth daily. 90 tablet 3   Cholecalciferol (VITAMIN D -3) 25 MCG (1000 UT) CAPS Take 1,000 Units by mouth daily with lunch.     Cyanocobalamin (VITAMIN B-12 PO) Take 1 tablet by mouth daily with lunch.     doxepin (SINEQUAN) 10 MG capsule Take 10-20 mg by mouth See admin instructions. Take 2 capsules (20 mg) by mouth at 5pm & take 2 capsules (20 mg) by mouth at 10p & take 1 capsule (10 mg) by mouth at midnight, if needed.     Hydrocortisone (CORTIZONE-10 EX) Apply 1 Application topically 3 (three) times daily as needed (rash/skin irritation/itching.).     ibuprofen  (ADVIL ) 200 MG tablet Take 200 mg by mouth as needed.     Multiple Vitamin (MULTIVITAMIN WITH MINERALS) TABS tablet Take 1 tablet by mouth daily with lunch.     omeprazole (PRILOSEC) 20 MG capsule Take 20 mg by mouth every evening.     No current facility-administered medications for this visit.     REVIEW OF SYSTEMS:   Constitutional: Denies fevers, chills or night sweats Eyes: Denies blurriness of vision Ears, nose, mouth, throat, and face: Denies mucositis or sore throat Respiratory: Denies cough, dyspnea or wheezes Cardiovascular: Denies palpitation, chest discomfort or lower extremity swelling Gastrointestinal:  Denies nausea, heartburn or change in bowel habits Skin: Denies abnormal skin rashes Lymphatics: Denies new lymphadenopathy or easy bruising Neurological:Denies numbness, tingling or new weaknesses Behavioral/Psych: Mood is stable, no new changes  All other systems were reviewed with the patient and are negative.  PHYSICAL EXAMINATION:   Vitals:   07/12/23 1430  BP: 100/61  Pulse: 100  Resp: 17  Temp: (!) 97.4 F (36.3 C)  SpO2: 94%    GENERAL:alert, no distress and comfortable SKIN: skin color, texture, turgor are normal, no rashes or significant lesions LYMPH:  no palpable lymphadenopathy in the cervical, axillary or inguinal LUNGS: clear to auscultation and percussion with  normal breathing effort HEART: regular rate & rhythm and no murmurs and no lower extremity edema ABDOMEN:abdomen soft, non-tender and normal bowel sounds Musculoskeletal:no cyanosis of digits and no clubbing  NEURO: alert & oriented x 3 with fluent speech  LABORATORY DATA:  I have reviewed the data as listed below and labs from primary care office on 06/21/2023   Rheumatoid factor: Less than 10, negative Iron: 30, ferritin: 106, TSAT: 22 iron saturation: 241  Lab Results  Component Value Date   WBC 9.9 06/29/2023   NEUTROABS 4.9 06/29/2023   HGB 14.6 06/29/2023   HCT 43.1 06/29/2023   MCV 89.6 06/29/2023   PLT 481 (H) 06/29/2023      Chemistry      Component Value Date/Time   NA 144 09/08/2022 1422   K 4.4 09/08/2022 1422   CL 103 09/08/2022 1422   CO2 25 09/08/2022 1422   BUN 16 09/08/2022 1422   CREATININE 0.75 09/08/2022 1422      Component Value Date/Time   CALCIUM  9.9 09/08/2022 1422   ALKPHOS 122 (  H) 09/08/2022 1422   AST 40 09/08/2022 1422   ALT 36 (H) 09/08/2022 1422   BILITOT 0.5 09/08/2022 1422      Latest Reference Range & Units 06/29/23 11:53  Sed Rate 0 - 22 mm/hr 26 (H)  (H): Data is abnormally high   Latest Reference Range & Units 06/29/23 11:53 06/29/23 11:59  Uric Acid, Serum 2.5 - 7.1 mg/dL  3.2  LDH 98 - 161 U/L 132   CRP <1.0 mg/dL 1.0 (H)   (H): Data is abnormally high   06/29/23 11:53  CALR +MPL + E12-E15  (REFLEX) Negative  JAK2 V617F RFX CALR/MPL/E12-15 Negative    Flow cytometry:  DIAGNOSIS:   - Predominance of T cells with nonspecific changes  - No monoclonal B-cell population identified    GATING AND PHENOTYPIC ANALYSIS:   Gated population: Flow cytometric immunophenotyping is performed using antibodies to the antigens listed in the table below. Electronic gates are placed around a cell cluster displaying light scatter properties corresponding to: lymphocytes   Abnormal Cells in gated population: N/A   Phenotype of Abnormal  Cells: N/A   RADIOGRAPHIC STUDIES: I have personally reviewed the radiological images as listed and agreed with the findings in the report.  None to review

## 2023-07-12 NOTE — Assessment & Plan Note (Signed)
 Patient has a lymphocytes with normal WBC.  Likely inflammatory. No B symptoms. ESR, CRP: Elevated Flow cytometry: Negative LDH, uric acid: Normal  -Continue to monitor counts

## 2023-07-20 ENCOUNTER — Ambulatory Visit (HOSPITAL_COMMUNITY)

## 2023-07-20 ENCOUNTER — Other Ambulatory Visit (HOSPITAL_COMMUNITY)

## 2023-09-24 DIAGNOSIS — R195 Other fecal abnormalities: Secondary | ICD-10-CM | POA: Diagnosis not present

## 2023-09-24 DIAGNOSIS — F419 Anxiety disorder, unspecified: Secondary | ICD-10-CM | POA: Diagnosis not present

## 2023-09-26 DIAGNOSIS — R195 Other fecal abnormalities: Secondary | ICD-10-CM | POA: Diagnosis not present

## 2023-10-10 ENCOUNTER — Inpatient Hospital Stay: Attending: Oncology

## 2023-10-10 DIAGNOSIS — D72820 Lymphocytosis (symptomatic): Secondary | ICD-10-CM | POA: Insufficient documentation

## 2023-10-10 DIAGNOSIS — D75839 Thrombocytosis, unspecified: Secondary | ICD-10-CM | POA: Insufficient documentation

## 2023-10-10 DIAGNOSIS — Z79899 Other long term (current) drug therapy: Secondary | ICD-10-CM | POA: Diagnosis not present

## 2023-10-10 LAB — IRON AND TIBC
Iron: 125 ug/dL (ref 28–170)
Saturation Ratios: 28 % (ref 10.4–31.8)
TIBC: 455 ug/dL — ABNORMAL HIGH (ref 250–450)
UIBC: 330 ug/dL

## 2023-10-10 LAB — CBC WITH DIFFERENTIAL/PLATELET
Abs Immature Granulocytes: 0.02 K/uL (ref 0.00–0.07)
Basophils Absolute: 0.1 K/uL (ref 0.0–0.1)
Basophils Relative: 1 %
Eosinophils Absolute: 0.5 K/uL (ref 0.0–0.5)
Eosinophils Relative: 5 %
HCT: 46 % (ref 36.0–46.0)
Hemoglobin: 15 g/dL (ref 12.0–15.0)
Immature Granulocytes: 0 %
Lymphocytes Relative: 45 %
Lymphs Abs: 4.5 K/uL — ABNORMAL HIGH (ref 0.7–4.0)
MCH: 29.6 pg (ref 26.0–34.0)
MCHC: 32.6 g/dL (ref 30.0–36.0)
MCV: 90.9 fL (ref 80.0–100.0)
Monocytes Absolute: 0.4 K/uL (ref 0.1–1.0)
Monocytes Relative: 4 %
Neutro Abs: 4.6 K/uL (ref 1.7–7.7)
Neutrophils Relative %: 45 %
Platelets: 499 K/uL — ABNORMAL HIGH (ref 150–400)
RBC: 5.06 MIL/uL (ref 3.87–5.11)
RDW: 13.2 % (ref 11.5–15.5)
WBC: 10 K/uL (ref 4.0–10.5)
nRBC: 0 % (ref 0.0–0.2)

## 2023-10-10 LAB — COMPREHENSIVE METABOLIC PANEL WITH GFR
ALT: 26 U/L (ref 0–44)
AST: 41 U/L (ref 15–41)
Albumin: 4.2 g/dL (ref 3.5–5.0)
Alkaline Phosphatase: 94 U/L (ref 38–126)
Anion gap: 13 (ref 5–15)
BUN: 13 mg/dL (ref 6–20)
CO2: 23 mmol/L (ref 22–32)
Calcium: 9.3 mg/dL (ref 8.9–10.3)
Chloride: 103 mmol/L (ref 98–111)
Creatinine, Ser: 1 mg/dL (ref 0.44–1.00)
GFR, Estimated: >60 mL/min (ref >60)
Glucose, Bld: 111 mg/dL — ABNORMAL HIGH (ref 70–99)
Potassium: 4.2 mmol/L (ref 3.5–5.1)
Sodium: 139 mmol/L (ref 135–145)
Total Bilirubin: 0.8 mg/dL (ref 0.0–1.2)
Total Protein: 7.8 g/dL (ref 6.5–8.1)

## 2023-10-10 LAB — FERRITIN: Ferritin: 42 ng/mL (ref 11–307)

## 2023-10-16 NOTE — Progress Notes (Unsigned)
 [facility] [address], [place] [address]   CLINIC:  Medical Oncology/Hematology  PCP:  Shona Norleen PEDLAR, MD [address] [place] [address] [phone]   REASON FOR VISIT:  Follow-up for lymphocytosis and thrombocytosis  CURRENT THERAPY: Surveillance  INTERVAL HISTORY:   Stacey Macdonald 58 y.o. female returns for routine follow-up of lymphocytosis and thrombocytosis, which is thought to be reactive due to inflammation.  At today's visit, she reports feeling well.  No recent hospitalizations, surgeries, or changes in baseline health status. She has been a bit more fatigued lately - was previously taking multivitamin at home.  She does not take any iron pills.   No history of  blood clots. She denies any aquagenic pruritus, Raynaud's, or erythromelalgia. She denies any vasomotor symptoms (dizziness, tinnitus, blurry vision, strokelike symptoms, neuropathy).  She does have intermittent headaches. She reports occasional cold sweats about once a week, no particular time that they occur. She denies any fever, chills, night sweats, unintentional weight loss. No masses or lymphadenopathy She reports worsening back pain in the past few years, as well as some pain in her right hip.  She denies any abnormal rashes.  She denies any known history of chronic inflammatory or connective tissue disease.  She denies any recent steroids (systemic oral/injection, inhaled). She denies any infections or antibiotics (including skin infections). She quit smoking in 2011.  She has 50% energy and 100% appetite. She endorses that she is maintaining a stable weight.  ASSESSMENT & PLAN:  1.  Lymphocytosis - Mild elevation in lymphocytes since at least July 2024.  Total WBC normal. - Flow cytometry (06/29/2023) was negative, but with nonspecific changes (predominance of T cells) - LDH and uric acid normal. - ESR and CRP elevated, indicating inflammation, but of unknown  source. - No known autoimmune connective tissue disorder, but she does report worsening back pain over the past several years, as well as pain in her hip. - She quit smoking in 2011. - She denies any recent steroids, infections, or antibiotics. - No vasomotor symptoms, masses, lymphadenopathy, or B symptoms. - No masses or lymphadenopathy on exam. - Most recent labs (10/10/2023): Normal WBC 10.0, absolute lymphocytes 4.5 - PLAN: Lymphocytosis likely inflammatory and reactive. - Offered annual follow-up, but patient prefers to be seen in 6 months for peace of mind. - We will repeat CBC/D with flow cytometry and inflammatory markers in 6 months.  If stable at that time, will discharge to PCP.  Patient should return sooner if she has lymphocytes > 6.0 for 2 consecutive months.  2.  Thrombocytosis - Patient has elevated platelets since at least July 2024.  - No history of blood clots - No history of splenectomy - MPN analysis was NEGATIVE (no mutations of JAK2 V617F, CALR, MPL, or E 12-15) - ESR and CRP elevated, indicating inflammation, but of unknown source. - No known autoimmune connective tissue disorder, but she does report worsening back pain over the past several years, as well as pain in her hip. - She quit smoking in 2011. - She denies any recent steroids, infections, or antibiotics. - No vasomotor symptoms, masses, lymphadenopathy, or B symptoms. - Most recent labs (10/10/2023): Platelets 499, overall stable. - Mild iron deficiency noted (10/10/2023) with ferritin 42, iron saturation 28%, elevated TIBC 455 - PLAN: Recommend starting ferrous sulfate 325 mg every other day  - Repeat CBC/D with iron panel and inflammatory markers in 6 months.  If stable at that time,  will discharge to PCP.  Patient should return sooner if she has platelets > 600 for 2 consecutive months. - No indication for prophylactic anticoagulation or aspirin at these levels, as there is no increased risk of thrombosis  at this level.  No indication for cytoreductive therapy / hydroxyurea.  PLAN SUMMARY: >> Labs in 6 months = CBC/D, LDH, uric acid, ESR, CRP, ANA, RF, anti-CCP, flow cytometry, ferritin, iron/TIBC >> OFFICE visit in 6 months (1 week after labs)     REVIEW OF SYSTEMS:   Review of Systems  Constitutional:  Positive for fatigue. Negative for appetite change, chills, diaphoresis, fever and unexpected weight change.  HENT:   Negative for lump/mass and nosebleeds.   Eyes:  Negative for eye problems.  Respiratory:  Negative for cough, hemoptysis and shortness of breath.   Cardiovascular:  Negative for chest pain, leg swelling and palpitations.  Gastrointestinal:  Negative for abdominal pain, blood in stool, constipation, diarrhea, nausea and vomiting.  Genitourinary:  Negative for hematuria.   Skin: Negative.   Neurological:  Positive for headaches. Negative for dizziness and light-headedness.  Hematological:  Does not bruise/bleed easily.     PHYSICAL EXAM:  ECOG PERFORMANCE STATUS: 1 - Symptomatic but completely ambulatory  Vitals:   10/17/23 1444  BP: 110/74  Pulse: (!) 102  Resp: 18  Temp: (!) 97.3 F (36.3 C)  SpO2: 92%   Filed Weights   10/17/23 1444  Weight: 150 lb 2.1 oz (68.1 kg)   Physical Exam Constitutional:      Appearance: Normal appearance. She is normal weight.     Comments: No masses or lymphadenopathy on exam.  Cardiovascular:     Heart sounds: Normal heart sounds.  Pulmonary:     Breath sounds: Normal breath sounds.  Neurological:     General: No focal deficit present.     Mental Status: Mental status is at baseline.  Psychiatric:        Behavior: Behavior normal. Behavior is cooperative.     PAST MEDICAL/SURGICAL HISTORY:  Past Medical History:  Diagnosis Date   Adjustment disorder with depressed mood    Agoraphobia with panic attacks    GERD (gastroesophageal reflux disease)    Heart palpitations    Insomnia    Nicotine dependence     Osteoporosis    Pure hyperglyceridemia    Tachycardia    Uterine cancer (HCC)    pre cancerous cells. Had laser removal of cells.   Past Surgical History:  Procedure Laterality Date   COLONOSCOPY WITH PROPOFOL  N/A 08/24/2022   Procedure: COLONOSCOPY WITH PROPOFOL ;  Surgeon: Shaaron Lamar HERO, MD;  Location: [facility];  Service: Endoscopy;  Laterality: N/A;  1:15 pm, asa 2   POLYPECTOMY  08/24/2022   Procedure: POLYPECTOMY;  Surgeon: Shaaron Lamar HERO, MD;  Location: [facility];  Service: Endoscopy;;   POLYPECTOMY  08/24/2022   Procedure: POLYPECTOMY INTESTINAL;  Surgeon: Shaaron Lamar HERO, MD;  Location: [facility];  Service: Endoscopy;;   stage 2 precancer cells removal  1990's   TUBAL LIGATION     tubal surgeries   2000    SOCIAL HISTORY:  Social History   Socioeconomic History   Marital status: Divorced    Spouse name: Not on file   Number of children: Not on file   Years of education: Not on file   Highest education level: Not on file  Occupational History   Not on file  Tobacco Use   Smoking status: Former  Current packs/day: 0.00    Types: Cigarettes    Quit date: 02/23/2008    Years since quitting: 15.6   Smokeless tobacco: Never  Vaping Use   Vaping status: Never Used  Substance and Sexual Activity   Alcohol use: Not Currently   Drug use: Not Currently    Types: Marijuana    Comment: rare once occurence   Sexual activity: Not Currently    Birth control/protection: Surgical, Post-menopausal    Comment: tubal  Other Topics Concern   Not on file  Social History Narrative   Not on file   Social Drivers of Health   Financial Resource Strain: Low Risk  (10/06/2020)   Overall Financial Resource Strain (CARDIA)    Difficulty of Paying Living Expenses: Not hard at all  Food Insecurity: No Food Insecurity (06/29/2023)   Hunger Vital Sign    Worried About Running Out of Food in the Last Year: Never true    Ran Out of Food in the Last Year: Never true   Transportation Needs: No Transportation Needs (06/29/2023)   PRAPARE - Administrator, Civil Service (Medical): No    Lack of Transportation (Non-Medical): No  Physical Activity: Insufficiently Active (09/11/2022)   Received from [facility]   Exercise Vital Sign    On average, how many days per week do you engage in moderate to strenuous exercise (like a brisk walk)?: 2 days    On average, how many minutes do you engage in exercise at this level?: 30 min  Stress: Stress Concern Present (09/11/2022)   Received from [facility] - Occupational Stress Questionnaire    Feeling of Stress : Very much  Social Connections: Socially Isolated (10/06/2020)   Social Connection and Isolation Panel    Frequency of Communication with Friends and Family: Once a week    Frequency of Social Gatherings with Friends and Family: Once a week    Attends Religious Services: 1 to 4 times per year    Active Member of [employer] or Organizations: No    Attends Banker Meetings: Never    Marital Status: Divorced  Catering manager Violence: Not At Risk (06/29/2023)   Humiliation, Afraid, Rape, and Kick questionnaire    Fear of Current or Ex-Partner: No    Emotionally Abused: No    Physically Abused: No    Sexually Abused: No    FAMILY HISTORY:  Family History  Problem Relation Age of Onset   Anxiety disorder Mother    Depression Mother    COPD Mother    Heart attack Father    Lung cancer Sister        stage 2. has received radiation   Gallbladder disease Sister        porcelain gallbladder   COPD Sister    Hypertension Maternal Grandmother    Other Maternal Grandfather        bleeding ulcer   Colon cancer Neg Hx     CURRENT MEDICATIONS:  Outpatient Encounter Medications as of 10/17/2023  Medication Sig   albuterol (VENTOLIN HFA) 108 (90 Base) MCG/ACT inhaler Inhale 2 puffs into the lungs every 4 hours.   ALPRAZolam (XANAX) 1 MG  tablet Take 1 mg by mouth See admin instructions. Take 1 tablet (1 mg) by mouth at 0830, take 1 tablet (1 mg) by mouth at 1400, take 1 tablet (1 mg) by mouth at 1700, take 1 tablet (1 mg) by mouth at  2130, take 0.5 tablet (0.5 mg) by mouth at 0100 & may take an additional 0.5 tablet (0.5 mg) by mouth at 0200.   atenolol  (TENORMIN ) 25 MG tablet TAKE (1/2) TABLET BY MOUTH TWICE DAILY.   atorvastatin  (LIPITOR) 40 MG tablet Take 1 tablet (40 mg total) by mouth daily.   Cholecalciferol (VITAMIN D -3) 25 MCG (1000 UT) CAPS Take 1,000 Units by mouth daily with lunch.   Cyanocobalamin (VITAMIN B-12 PO) Take 1 tablet by mouth daily with lunch.   doxepin (SINEQUAN) 10 MG capsule Take 10-20 mg by mouth See admin instructions. Take 2 capsules (20 mg) by mouth at 5pm & take 2 capsules (20 mg) by mouth at 10p & take 1 capsule (10 mg) by mouth at midnight, if needed.   Hydrocortisone (CORTIZONE-10 EX) Apply 1 Application topically 3 (three) times daily as needed (rash/skin irritation/itching.).   ibuprofen  (ADVIL ) 200 MG tablet Take 200 mg by mouth as needed.   Multiple Vitamin (MULTIVITAMIN WITH MINERALS) TABS tablet Take 1 tablet by mouth daily with lunch.   omeprazole (PRILOSEC) 20 MG capsule Take 20 mg by mouth every evening.   No facility-administered encounter medications on file as of 10/17/2023.    ALLERGIES:  Allergies  Allergen Reactions   Antihistamines, Chlorpheniramine-Type     Allergic to antihistamines too   Morphine And Codeine Other (See Comments)    Unknown reaction.    LABORATORY DATA:  I have reviewed the labs as listed.  CBC    Component Value Date/Time   WBC 10.0 10/10/2023 1445   RBC 5.06 10/10/2023 1445   HGB 15.0 10/10/2023 1445   HGB 14.0 09/08/2022 1422   HCT 46.0 10/10/2023 1445   HCT 42.1 09/08/2022 1422   PLT 499 (H) 10/10/2023 1445   PLT 512 (H) 09/08/2022 1422   MCV 90.9 10/10/2023 1445   MCV 89 09/08/2022 1422   MCH 29.6 10/10/2023 1445   MCHC 32.6 10/10/2023  1445   RDW 13.2 10/10/2023 1445   RDW 12.8 09/08/2022 1422   LYMPHSABS 4.5 (H) 10/10/2023 1445   LYMPHSABS 3.8 (H) 09/08/2022 1422   MONOABS 0.4 10/10/2023 1445   EOSABS 0.5 10/10/2023 1445   EOSABS 0.3 09/08/2022 1422   BASOSABS 0.1 10/10/2023 1445   BASOSABS 0.1 09/08/2022 1422      Latest Ref Rng & Units 10/10/2023    2:45 PM 09/08/2022    2:22 PM  CMP  Glucose 70 - 99 mg/dL 888  98   BUN 6 - 20 mg/dL 13  16   Creatinine 9.55 - 1.00 mg/dL 8.99  9.24   Sodium 864 - 145 mmol/L 139  144   Potassium 3.5 - 5.1 mmol/L 4.2  4.4   Chloride 98 - 111 mmol/L 103  103   CO2 22 - 32 mmol/L 23  25   Calcium  8.9 - 10.3 mg/dL 9.3  9.9   Total Protein 6.5 - 8.1 g/dL 7.8  7.4   Total Bilirubin 0.0 - 1.2 mg/dL 0.8  0.5   Alkaline Phos 38 - 126 U/L 94  122   AST 15 - 41 U/L 41  40   ALT 0 - 44 U/L 26  36     DIAGNOSTIC IMAGING:  I have independently reviewed the relevant imaging and discussed with the patient.   WRAP UP:  All questions were answered. The patient knows to call the clinic with any problems, questions or concerns.  Medical decision making: Moderate  Time spent on visit: I spent 20  minutes counseling the patient face to face. The total time spent in the appointment was 30 minutes and more than 50% was on counseling.  Pleasant Stacey Barefoot, PA-C  10/17/2023 3:40 PM

## 2023-10-17 ENCOUNTER — Inpatient Hospital Stay: Attending: Oncology | Admitting: Physician Assistant

## 2023-10-17 VITALS — BP 110/74 | HR 102 | Temp 97.3°F | Resp 18 | Wt 150.1 lb

## 2023-10-17 DIAGNOSIS — Z8249 Family history of ischemic heart disease and other diseases of the circulatory system: Secondary | ICD-10-CM | POA: Insufficient documentation

## 2023-10-17 DIAGNOSIS — Z8542 Personal history of malignant neoplasm of other parts of uterus: Secondary | ICD-10-CM | POA: Diagnosis not present

## 2023-10-17 DIAGNOSIS — Z885 Allergy status to narcotic agent status: Secondary | ICD-10-CM | POA: Diagnosis not present

## 2023-10-17 DIAGNOSIS — M25551 Pain in right hip: Secondary | ICD-10-CM | POA: Diagnosis not present

## 2023-10-17 DIAGNOSIS — Z87891 Personal history of nicotine dependence: Secondary | ICD-10-CM | POA: Insufficient documentation

## 2023-10-17 DIAGNOSIS — R5383 Other fatigue: Secondary | ICD-10-CM | POA: Diagnosis not present

## 2023-10-17 DIAGNOSIS — R519 Headache, unspecified: Secondary | ICD-10-CM | POA: Insufficient documentation

## 2023-10-17 DIAGNOSIS — Z825 Family history of asthma and other chronic lower respiratory diseases: Secondary | ICD-10-CM | POA: Insufficient documentation

## 2023-10-17 DIAGNOSIS — M549 Dorsalgia, unspecified: Secondary | ICD-10-CM | POA: Insufficient documentation

## 2023-10-17 DIAGNOSIS — Z79899 Other long term (current) drug therapy: Secondary | ICD-10-CM | POA: Diagnosis not present

## 2023-10-17 DIAGNOSIS — R7982 Elevated C-reactive protein (CRP): Secondary | ICD-10-CM | POA: Diagnosis not present

## 2023-10-17 DIAGNOSIS — Z801 Family history of malignant neoplasm of trachea, bronchus and lung: Secondary | ICD-10-CM | POA: Insufficient documentation

## 2023-10-17 DIAGNOSIS — D75839 Thrombocytosis, unspecified: Secondary | ICD-10-CM | POA: Diagnosis not present

## 2023-10-17 DIAGNOSIS — Z8379 Family history of other diseases of the digestive system: Secondary | ICD-10-CM | POA: Insufficient documentation

## 2023-10-17 DIAGNOSIS — D72820 Lymphocytosis (symptomatic): Secondary | ICD-10-CM | POA: Diagnosis not present

## 2023-10-17 DIAGNOSIS — Z818 Family history of other mental and behavioral disorders: Secondary | ICD-10-CM | POA: Diagnosis not present

## 2023-10-17 DIAGNOSIS — Z604 Social exclusion and rejection: Secondary | ICD-10-CM | POA: Insufficient documentation

## 2023-10-17 NOTE — Patient Instructions (Signed)
 [facility] at [facility] **VISIT SUMMARY & IMPORTANT INSTRUCTIONS **   You were seen today by Pleasant Barefoot PA-C for your follow-up visit.    ELEVATED PLATELETS & WHITE BLOOD CELLS So far, your labs do not show any sign of cancerous causes of high platelets and high white blood cells. We suspect that your high platelets and high white blood cells are related to underlying inflammation. Will recheck labs in 6 months.  Start taking iron tablet once daily. Take over-the-counter ferrous sulfate 325 mg every other day. Take your iron in the morning if possible (unless is interacts with other medications such as antacids). Take your iron pill with a glass of orange juice to help your body absorb it better. If you take any antacid or acid reflux medicine at home, take your iron at a different time of day, separated by at least 2 hours from your antacid medication. If you experience any constipation from your iron tablet, try taking over the stool softener such as Colace once daily.  Drink plenty of water.  Eat a high fiber diet and consider taking a fiber supplement.  FOLLOW-UP APPOINTMENT: 6 months  ** Thank you for trusting me with your healthcare!  I strive to provide all of my patients with quality care at each visit.  If you receive a survey for this visit, I would be so grateful to you for taking the time to provide feedback.  Thank you in advance!  ~ Surabhi Gadea                                        Dr. Mickiel Davonna Pleasant Barefoot, PA-C          Delon Hope, NP   - - - - - - - - - - - - - - - - - -    Thank you for choosing [facility] at [facility] to provide your oncology and hematology care.  To afford each patient quality time with our provider, please arrive at least 15 minutes before your scheduled appointment time.   If you have a lab appointment with the Cancer Center please come in thru the Main Entrance and check  in at the main information desk.  You need to re-schedule your appointment should you arrive 10 or more minutes late.  We strive to give you quality time with our providers, and arriving late affects you and other patients whose appointments are after yours.  Also, if you no show three or more times for appointments you may be dismissed from the clinic at the providers discretion.     Again, thank you for choosing [facility].  Our hope is that these requests will decrease the amount of time that you wait before being seen by our physicians.       _____________________________________________________________  Should you have questions after your visit to [facility], please contact our office at [phone] and follow the prompts.  Our office hours are 8:00 a.m. and 4:30 p.m. Monday - Friday.  Please note that voicemails left after 4:00 p.m. may not be returned until the following business day.  We are closed weekends and major holidays.  You do have access to a nurse 24-7, just call the main number to the clinic [phone] and do not  press any options, hold on the line and a nurse will answer the phone.    For prescription refill requests, have your pharmacy contact our office and allow 72 hours.

## 2023-11-20 DIAGNOSIS — D75839 Thrombocytosis, unspecified: Secondary | ICD-10-CM | POA: Diagnosis not present

## 2023-11-20 DIAGNOSIS — F419 Anxiety disorder, unspecified: Secondary | ICD-10-CM | POA: Diagnosis not present

## 2023-11-20 DIAGNOSIS — I10 Essential (primary) hypertension: Secondary | ICD-10-CM | POA: Diagnosis not present

## 2023-11-20 DIAGNOSIS — Z0001 Encounter for general adult medical examination with abnormal findings: Secondary | ICD-10-CM | POA: Diagnosis not present

## 2023-11-20 DIAGNOSIS — Z7689 Persons encountering health services in other specified circumstances: Secondary | ICD-10-CM | POA: Diagnosis not present

## 2023-11-20 DIAGNOSIS — Z87891 Personal history of nicotine dependence: Secondary | ICD-10-CM | POA: Diagnosis not present

## 2023-11-20 DIAGNOSIS — J439 Emphysema, unspecified: Secondary | ICD-10-CM | POA: Diagnosis not present

## 2023-11-20 DIAGNOSIS — D72829 Elevated white blood cell count, unspecified: Secondary | ICD-10-CM | POA: Diagnosis not present

## 2023-11-20 DIAGNOSIS — R748 Abnormal levels of other serum enzymes: Secondary | ICD-10-CM | POA: Diagnosis not present

## 2024-04-14 ENCOUNTER — Other Ambulatory Visit

## 2024-04-23 ENCOUNTER — Ambulatory Visit: Admitting: Physician Assistant

## 2024-10-14 ENCOUNTER — Inpatient Hospital Stay

## 2024-10-21 ENCOUNTER — Inpatient Hospital Stay: Admitting: Physician Assistant
